# Patient Record
Sex: Female | Born: 1960 | Race: White | Hispanic: No | Marital: Married | Smoking: Current every day smoker
Health system: Southern US, Community
[De-identification: ages and names within clinical notes are randomized; demographics above are authoritative.]

## PROBLEM LIST (undated history)

## (undated) HISTORY — PX: TUBAL LIGATION: SHX77

---

## 2015-05-01 ENCOUNTER — Other Ambulatory Visit: Payer: Self-pay | Admitting: Internal Medicine

## 2015-05-14 ENCOUNTER — Other Ambulatory Visit: Payer: Self-pay

## 2015-05-14 ENCOUNTER — Encounter: Payer: Self-pay | Admitting: Internal Medicine

## 2015-05-14 DIAGNOSIS — I10 Essential (primary) hypertension: Secondary | ICD-10-CM | POA: Insufficient documentation

## 2015-05-14 DIAGNOSIS — E782 Mixed hyperlipidemia: Secondary | ICD-10-CM | POA: Insufficient documentation

## 2015-05-14 DIAGNOSIS — G43009 Migraine without aura, not intractable, without status migrainosus: Secondary | ICD-10-CM | POA: Insufficient documentation

## 2015-05-14 DIAGNOSIS — Z8742 Personal history of other diseases of the female genital tract: Secondary | ICD-10-CM | POA: Insufficient documentation

## 2015-05-14 DIAGNOSIS — F17200 Nicotine dependence, unspecified, uncomplicated: Secondary | ICD-10-CM | POA: Insufficient documentation

## 2015-05-14 DIAGNOSIS — F17210 Nicotine dependence, cigarettes, uncomplicated: Secondary | ICD-10-CM | POA: Insufficient documentation

## 2015-05-14 DIAGNOSIS — E785 Hyperlipidemia, unspecified: Secondary | ICD-10-CM | POA: Insufficient documentation

## 2015-05-14 MED ORDER — BENAZEPRIL-HYDROCHLOROTHIAZIDE 20-25 MG PO TABS: 1.0000 | ORAL_TABLET | Freq: Every day | ORAL | Status: DC

## 2015-08-28 ENCOUNTER — Encounter: Payer: Self-pay | Admitting: Internal Medicine

## 2015-08-28 ENCOUNTER — Ambulatory Visit (INDEPENDENT_AMBULATORY_CARE_PROVIDER_SITE_OTHER): Payer: BLUE CROSS/BLUE SHIELD | Admitting: Internal Medicine

## 2015-08-28 VITALS — BP 138/80 | HR 72 | Resp 16 | Ht 65.5 in | Wt 179.0 lb

## 2015-08-28 DIAGNOSIS — Z0000 Encounter for general adult medical examination without abnormal findings: Secondary | ICD-10-CM | POA: Diagnosis not present

## 2015-08-28 DIAGNOSIS — Z1211 Encounter for screening for malignant neoplasm of colon: Secondary | ICD-10-CM

## 2015-08-28 DIAGNOSIS — Z8742 Personal history of other diseases of the female genital tract: Secondary | ICD-10-CM | POA: Diagnosis not present

## 2015-08-28 DIAGNOSIS — Z1159 Encounter for screening for other viral diseases: Secondary | ICD-10-CM | POA: Diagnosis not present

## 2015-08-28 DIAGNOSIS — Z1239 Encounter for other screening for malignant neoplasm of breast: Secondary | ICD-10-CM | POA: Diagnosis not present

## 2015-08-28 DIAGNOSIS — E785 Hyperlipidemia, unspecified: Secondary | ICD-10-CM

## 2015-08-28 DIAGNOSIS — I10 Essential (primary) hypertension: Secondary | ICD-10-CM | POA: Diagnosis not present

## 2015-08-28 DIAGNOSIS — R319 Hematuria, unspecified: Secondary | ICD-10-CM | POA: Diagnosis not present

## 2015-08-28 DIAGNOSIS — Z114 Encounter for screening for human immunodeficiency virus [HIV]: Secondary | ICD-10-CM

## 2015-08-28 LAB — POC URINALYSIS WITH MICROSCOPIC (NON AUTO)MANUAL RESULT
Bacteria, UA: 0
Bilirubin, UA: NEGATIVE
Crystals: 0
EPITHELIAL CELLS, URINE PER MICROSCOPY: 0
Glucose, UA: NEGATIVE
KETONES UA: NEGATIVE
Leukocytes, UA: NEGATIVE
MUCUS UA: 0
NITRITE UA: NEGATIVE
PH UA: 6
RBC: 2 M/uL — AB (ref 4.04–5.48)
Spec Grav, UA: 1.015
Urobilinogen, UA: 0.2
WBC CASTS UA: 0

## 2015-08-28 MED ORDER — BENAZEPRIL-HYDROCHLOROTHIAZIDE 20-25 MG PO TABS: 1.0000 | ORAL_TABLET | Freq: Every day | ORAL | Status: DC

## 2015-08-28 NOTE — Patient Instructions (Addendum)
DASH Eating Plan DASH stands for "Dietary Approaches to Stop Hypertension." The DASH eating plan is a healthy eating plan that has been shown to reduce high blood pressure (hypertension). Additional health benefits may include reducing the risk of type 2 diabetes mellitus, heart disease, and stroke. The DASH eating plan may also help with weight loss. WHAT DO I NEED TO KNOW ABOUT THE DASH EATING PLAN? For the DASH eating plan, you will follow these general guidelines:  Choose foods with a percent daily value for sodium of less than 5% (as listed on the food label).  Use salt-free seasonings or herbs instead of table salt or sea salt.  Check with your health care provider or pharmacist before using salt substitutes.  Eat lower-sodium products, often labeled as "lower sodium" or "no salt added."  Eat fresh foods.  Eat more vegetables, fruits, and low-fat dairy products.  Choose whole grains. Look for the word "whole" as the first word in the ingredient list.  Choose fish and skinless chicken or turkey more often than red meat. Limit fish, poultry, and meat to 6 oz (170 g) each day.  Limit sweets, desserts, sugars, and sugary drinks.  Choose heart-healthy fats.  Limit cheese to 1 oz (28 g) per day.  Eat more home-cooked food and less restaurant, buffet, and fast food.  Limit fried foods.  Cook foods using methods other than frying.  Limit canned vegetables. If you do use them, rinse them well to decrease the sodium.  When eating at a restaurant, ask that your food be prepared with less salt, or no salt if possible. WHAT FOODS CAN I EAT? Seek help from a dietitian for individual calorie needs. Grains Whole grain or whole wheat bread. Brown rice. Whole grain or whole wheat pasta. Quinoa, bulgur, and whole grain cereals. Low-sodium cereals. Corn or whole wheat flour tortillas. Whole grain cornbread. Whole grain crackers. Low-sodium crackers. Vegetables Fresh or frozen vegetables  (raw, steamed, roasted, or grilled). Low-sodium or reduced-sodium tomato and vegetable juices. Low-sodium or reduced-sodium tomato sauce and paste. Low-sodium or reduced-sodium canned vegetables.  Fruits All fresh, canned (in natural juice), or frozen fruits. Meat and Other Protein Products Ground beef (85% or leaner), grass-fed beef, or beef trimmed of fat. Skinless chicken or turkey. Ground chicken or turkey. Pork trimmed of fat. All fish and seafood. Eggs. Dried beans, peas, or lentils. Unsalted nuts and seeds. Unsalted canned beans. Dairy Low-fat dairy products, such as skim or 1% milk, 2% or reduced-fat cheeses, low-fat ricotta or cottage cheese, or plain low-fat yogurt. Low-sodium or reduced-sodium cheeses. Fats and Oils Tub margarines without trans fats. Light or reduced-fat mayonnaise and salad dressings (reduced sodium). Avocado. Safflower, olive, or canola oils. Natural peanut or almond butter. Other Unsalted popcorn and pretzels. The items listed above may not be a complete list of recommended foods or beverages. Contact your dietitian for more options. WHAT FOODS ARE NOT RECOMMENDED? Grains White bread. White pasta. White rice. Refined cornbread. Bagels and croissants. Crackers that contain trans fat. Vegetables Creamed or fried vegetables. Vegetables in a cheese sauce. Regular canned vegetables. Regular canned tomato sauce and paste. Regular tomato and vegetable juices. Fruits Dried fruits. Canned fruit in light or heavy syrup. Fruit juice. Meat and Other Protein Products Fatty cuts of meat. Ribs, chicken wings, bacon, sausage, bologna, salami, chitterlings, fatback, hot dogs, bratwurst, and packaged luncheon meats. Salted nuts and seeds. Canned beans with salt. Dairy Whole or 2% milk, cream, half-and-half, and cream cheese. Whole-fat or sweetened yogurt. Full-fat   cheeses or blue cheese. Nondairy creamers and whipped toppings. Processed cheese, cheese spreads, or cheese  curds. Condiments Onion and garlic salt, seasoned salt, table salt, and sea salt. Canned and packaged gravies. Worcestershire sauce. Tartar sauce. Barbecue sauce. Teriyaki sauce. Soy sauce, including reduced sodium. Steak sauce. Fish sauce. Oyster sauce. Cocktail sauce. Horseradish. Ketchup and mustard. Meat flavorings and tenderizers. Bouillon cubes. Hot sauce. Tabasco sauce. Marinades. Taco seasonings. Relishes. Fats and Oils Butter, stick margarine, lard, shortening, ghee, and bacon fat. Coconut, palm kernel, or palm oils. Regular salad dressings. Other Pickles and olives. Salted popcorn and pretzels. The items listed above may not be a complete list of foods and beverages to avoid. Contact your dietitian for more information. WHERE CAN I FIND MORE INFORMATION? National Heart, Lung, and [employer] Institute: www.nhlbi.nih.gov/health/health-topics/topics/dash/   This information is not intended to replace advice given to you by your health care provider. Make sure you discuss any questions you have with your health care provider.   Document Released: 07/14/2011 Document Revised: 08/15/2014 Document Reviewed: 05/29/2013 Elsevier Interactive Patient Education 2016 Elsevier Inc[employer]   - Smoking Cessation, Tips for Success If you are ready to quit smoking, congratulations! You have chosen to help yourself be healthier. Cigarettes bring nicotine, tar, carbon monoxide, and other irritants into your body. Your lungs, heart, and blood vessels will be able to work better without these poisons. There are many different ways to quit smoking. Nicotine gum, nicotine patches, a nicotine inhaler, or nicotine nasal spray can help with physical craving. Hypnosis, support groups, and medicines help break the habit of smoking. WHAT THINGS CAN I DO TO MAKE QUITTING EASIER?  Here are some tips to help you quit for good:  Pick a date when you will quit smoking completely. Tell all of your friends and family about your  plan to quit on that date.  Do not try to slowly cut down on the number of cigarettes you are smoking. Pick a quit date and quit smoking completely starting on that day.  Throw away all cigarettes.   Clean and remove all ashtrays from your home, work, and car.  On a card, write down your reasons for quitting. Carry the card with you and read it when you get the urge to smoke.  Cleanse your body of nicotine. Drink enough water and fluids to keep your urine clear or pale yellow. Do this after quitting to flush the nicotine from your body.  Learn to predict your moods. Do not let a bad situation be your excuse to have a cigarette. Some situations in your life might tempt you into wanting a cigarette.  Never have "just one" cigarette. It leads to wanting another and another. Remind yourself of your decision to quit.  Change habits associated with smoking. If you smoked while driving or when feeling stressed, try other activities to replace smoking. Stand up when drinking your coffee. Brush your teeth after eating. Sit in a different chair when you read the paper. Avoid alcohol while trying to quit, and try to drink fewer caffeinated beverages. Alcohol and caffeine may urge you to smoke.  Avoid foods and drinks that can trigger a desire to smoke, such as sugary or spicy foods and alcohol.  Ask people who smoke not to smoke around you.  Have something planned to do right after eating or having a cup of coffee. For example, plan to take a walk or exercise.  Try a relaxation exercise to calm you down and decrease your stress. Remember,   you may be tense and nervous for the first 2 weeks after you quit, but this will pass.  Find new activities to keep your hands busy. Play with a pen, coin, or rubber band. Doodle or draw things on paper.  Brush your teeth right after eating. This will help cut down on the craving for the taste of tobacco after meals. You can also try mouthwash.   Use oral  substitutes in place of cigarettes. Try using lemon drops, carrots, cinnamon sticks, or chewing gum. Keep them handy so they are available when you have the urge to smoke.  When you have the urge to smoke, try deep breathing.  Designate your home as a nonsmoking area.  If you are a heavy smoker, ask your health care provider about a prescription for nicotine chewing gum. It can ease your withdrawal from nicotine.  Reward yourself. Set aside the cigarette money you save and buy yourself something nice.  Look for support from others. Join a support group or smoking cessation program. Ask someone at home or at work to help you with your plan to quit smoking.  Always ask yourself, "Do I need this cigarette or is this just a reflex?" Tell yourself, "Today, I choose not to smoke," or "I do not want to smoke." You are reminding yourself of your decision to quit.  Do not replace cigarette smoking with electronic cigarettes (commonly called e-cigarettes). The safety of e-cigarettes is unknown, and some may contain harmful chemicals.  If you relapse, do not give up! Plan ahead and think about what you will do the next time you get the urge to smoke. HOW WILL I FEEL WHEN I QUIT SMOKING? You may have symptoms of withdrawal because your body is used to nicotine (the addictive substance in cigarettes). You may crave cigarettes, be irritable, feel very hungry, cough often, get headaches, or have difficulty concentrating. The withdrawal symptoms are only temporary. They are strongest when you first quit but will go away within 10-14 days. When withdrawal symptoms occur, stay in control. Think about your reasons for quitting. Remind yourself that these are signs that your body is healing and getting used to being without cigarettes. Remember that withdrawal symptoms are easier to treat than the major diseases that smoking can cause.  Even after the withdrawal is over, expect periodic urges to smoke. However, these  cravings are generally short lived and will go away whether you smoke or not. Do not smoke! WHAT RESOURCES ARE AVAILABLE TO HELP ME QUIT SMOKING? Your health care provider can direct you to community resources or hospitals for support, which may include:  Group support.  Education.  Hypnosis.  Therapy.   This information is not intended to replace advice given to you by your health care provider. Make sure you discuss any questions you have with your health care provider.   Document Released: 04/22/2004 Document Revised: 08/15/2014 Document Reviewed: 01/10/2013 Elsevier Interactive Patient Education 2016 Elsevier Inc.   -  

## 2015-08-28 NOTE — Progress Notes (Signed)
**Note Renee-Identified via Obfuscation** Date:  08/28/2015   Name:  Renee Graham   DOB:  December 04, 1960   MRN:  [id number]   Chief Complaint: Annual Exam and Hypertension DELORICE Graham is a 55 y.o. female who presents today for her Complete Annual Exam. She feels well. She reports exercising intermittently. She reports she is sleeping well. She is hesitant to have a colonoscopy but agrees to a referral later this year.  Hypertension This is a chronic problem. The current episode started more than 1 year ago. The problem is unchanged. The problem is controlled. Associated symptoms include headaches. Pertinent negatives include no chest pain, palpitations or shortness of breath. Past treatments include ACE inhibitors and diuretics. The current treatment provides significant improvement. There are no compliance problems.      Review of Systems  Constitutional: Negative for chills, fatigue and unexpected weight change.  HENT: Negative for hearing loss.   Eyes: Negative for visual disturbance.  Respiratory: Negative for cough, chest tightness and shortness of breath.   Cardiovascular: Negative for chest pain, palpitations and leg swelling.  Gastrointestinal: Negative for abdominal pain, diarrhea and constipation.  Endocrine: Negative for polydipsia and polyuria.  Genitourinary: Negative for dysuria, urgency, hematuria, vaginal bleeding and vaginal discharge.  Musculoskeletal: Negative for arthralgias.  Neurological: Positive for headaches.    Patient Active Problem List   Diagnosis Date Noted  . Benign essential HTN 05/14/2015  . Cigarette smoker 05/14/2015  . H/O abnormal cervical Papanicolaou smear 05/14/2015  . Migraine without aura and responsive to treatment 05/14/2015  . Hyperlipidemia 05/14/2015    Prior to Admission medications   Medication Sig Start Date End Date Taking? Authorizing Provider  benazepril-hydrochlorthiazide (LOTENSIN HCT) 20-25 MG tablet Take 1 tablet by mouth daily. 05/14/15  Yes Reubin Milan, MD      No Known Allergies  Past Surgical History  Procedure Laterality Date  . Tubal ligation      Social History  Substance Use Topics  . Smoking status: Current Every Day Smoker  . Smokeless tobacco: None  . Alcohol Use: None    Medication list has been reviewed and updated.   Physical Exam  Constitutional: She is oriented to person, place, and time. She appears well-developed and well-nourished. No distress.  HENT:  Head: Normocephalic and atraumatic.  Right Ear: Tympanic membrane and ear canal normal.  Left Ear: Tympanic membrane and ear canal normal.  Nose: Right sinus exhibits no maxillary sinus tenderness. Left sinus exhibits no maxillary sinus tenderness.  Mouth/Throat: Uvula is midline and oropharynx is clear and moist.  Eyes: Conjunctivae and EOM are normal. Right eye exhibits no discharge. Left eye exhibits no discharge. No scleral icterus.  Neck: Normal range of motion. Carotid bruit is not present. No erythema present. No thyromegaly present.  Cardiovascular: Normal rate, regular rhythm, normal heart sounds and normal pulses.   Pulmonary/Chest: Effort normal. No respiratory distress. She has no wheezes. Right breast exhibits no mass, no nipple discharge, no skin change and no tenderness. Left breast exhibits no mass, no nipple discharge, no skin change and no tenderness.  Abdominal: Soft. Bowel sounds are normal. There is no hepatosplenomegaly. There is no tenderness. There is no CVA tenderness.  Genitourinary: Uterus normal. There is no rash or tenderness on the right labia. There is no rash or tenderness on the left labia. Cervix exhibits no motion tenderness, no discharge and no friability. Right adnexum displays no mass, no tenderness and no fullness. Left adnexum displays no mass, no tenderness and no fullness.  No erythema, tenderness or bleeding in the vagina. No vaginal discharge found.  Moderate anterior vaginal prolapse  Musculoskeletal: Normal range of motion.   Lymphadenopathy:    She has no cervical adenopathy.    She has no axillary adenopathy.  Neurological: She is alert and oriented to person, place, and time. She has normal reflexes. No cranial nerve deficit or sensory deficit.  Skin: Skin is warm, dry and intact. No rash noted.  Psychiatric: She has a normal mood and affect. Her speech is normal and behavior is normal. Thought content normal.  Nursing note and vitals reviewed.   BP 138/80 mmHg  Pulse 72  Resp 16  Ht 5' 5.5" (1.664 m)  Wt 179 lb (81.194 kg)  BMI 29.32 kg/m2  LMP 02/06/2015 (Approximate)  Assessment and Plan: 1. Annual physical exam encouraged to reduce cigarette use with a goal to quit completely - CBC with Differential/Platelet - POC urinalysis w microscopic (non auto)  2. Need for hepatitis C screening test - Hepatitis C antibody  3. Colon cancer screening Will defer for now due to Urology referral  4. Benign essential HTN controlled - benazepril-hydrochlorthiazide (LOTENSIN HCT) 20-25 MG tablet; Take 1 tablet by mouth daily.  Dispense: 90 tablet; Refill: 3  5. H/O abnormal cervical Papanicolaou smear Repeated today Pelvic exam normal - Pap IG and HPV (high risk) DNA detection  6. Hyperlipidemia Will advise if medication is needed - Comprehensive metabolic panel - Lipid panel - TSH  7. Breast cancer screening Pt will schedule Mammogram in [place]  8. Hematuria Persistent in a smoker - Ambulatory referral to Urology  10. Encounter for screening for HIV - HIV antibody   Bari Edward, MD [facility]  08/28/2015

## 2015-08-29 LAB — TSH: TSH: 1.47 u[IU]/mL (ref 0.450–4.500)

## 2015-08-29 LAB — LIPID PANEL
CHOL/HDL RATIO: 5 ratio — AB (ref 0.0–4.4)
Cholesterol, Total: 269 mg/dL — ABNORMAL HIGH (ref 100–199)
HDL: 54 mg/dL (ref >39)
LDL CALC: 173 mg/dL — AB (ref 0–99)
TRIGLYCERIDES: 210 mg/dL — AB (ref 0–149)
VLDL Cholesterol Cal: 42 mg/dL — ABNORMAL HIGH (ref 5–40)

## 2015-08-29 LAB — COMPREHENSIVE METABOLIC PANEL
A/G RATIO: 1.9 (ref 1.1–2.5)
ALBUMIN: 4.7 g/dL (ref 3.5–5.5)
ALK PHOS: 73 IU/L (ref 39–117)
ALT: 24 IU/L (ref 0–32)
AST: 37 IU/L (ref 0–40)
BUN / CREAT RATIO: 11 (ref 9–23)
BUN: 10 mg/dL (ref 6–24)
Bilirubin Total: 0.6 mg/dL (ref 0.0–1.2)
CALCIUM: 10.6 mg/dL — AB (ref 8.7–10.2)
CO2: 26 mmol/L (ref 18–29)
CREATININE: 0.93 mg/dL (ref 0.57–1.00)
Chloride: 99 mmol/L (ref 96–106)
GFR calc Af Amer: 80 mL/min/{1.73_m2} (ref >59)
GFR, EST NON AFRICAN AMERICAN: 69 mL/min/{1.73_m2} (ref >59)
GLOBULIN, TOTAL: 2.5 g/dL (ref 1.5–4.5)
Glucose: 89 mg/dL (ref 65–99)
POTASSIUM: 4.1 mmol/L (ref 3.5–5.2)
SODIUM: 141 mmol/L (ref 134–144)
Total Protein: 7.2 g/dL (ref 6.0–8.5)

## 2015-08-29 LAB — CBC WITH DIFFERENTIAL/PLATELET
BASOS: 1 %
Basophils Absolute: 0 10*3/uL (ref 0.0–0.2)
EOS (ABSOLUTE): 0.2 10*3/uL (ref 0.0–0.4)
EOS: 2 %
HEMATOCRIT: 46.7 % — AB (ref 34.0–46.6)
HEMOGLOBIN: 15.7 g/dL (ref 11.1–15.9)
Immature Grans (Abs): 0 10*3/uL (ref 0.0–0.1)
Immature Granulocytes: 0 %
LYMPHS ABS: 2.6 10*3/uL (ref 0.7–3.1)
Lymphs: 37 %
MCH: 32.6 pg (ref 26.6–33.0)
MCHC: 33.6 g/dL (ref 31.5–35.7)
MCV: 97 fL (ref 79–97)
MONOCYTES: 7 %
MONOS ABS: 0.5 10*3/uL (ref 0.1–0.9)
Neutrophils Absolute: 3.8 10*3/uL (ref 1.4–7.0)
Neutrophils: 53 %
Platelets: 271 10*3/uL (ref 150–379)
RBC: 4.81 x10E6/uL (ref 3.77–5.28)
RDW: 13 % (ref 12.3–15.4)
WBC: 7.1 10*3/uL (ref 3.4–10.8)

## 2015-08-29 LAB — HIV ANTIBODY (ROUTINE TESTING W REFLEX): HIV Screen 4th Generation wRfx: NONREACTIVE

## 2015-08-29 LAB — HEPATITIS C ANTIBODY: Hep C Virus Ab: <0.1 s/co ratio (ref 0.0–0.9)

## 2015-08-31 ENCOUNTER — Telehealth: Payer: Self-pay

## 2015-08-31 NOTE — Telephone Encounter (Signed)
Spoke with patient. Patient advised of all results and verbalized understanding. Will call back with any future questions or concerns. MAH  

## 2015-08-31 NOTE — Telephone Encounter (Signed)
-----   Message from Reubin Milan, MD sent at 08/29/2015  5:10 PM EST ----- Cholesterol is very high - 10 yr risk of ASCVD is 10% so should be taking a cholesterol medication.  If agreeable, I will send to pharmacy and request a follow up OV and labs in 6 months.  Calcium is slightly high - stop any calcium supplements but add vitamin D.  Other labs are normal.  Hep C and HIV are negative.

## 2015-08-31 NOTE — Telephone Encounter (Signed)
Left message for patient to call back  

## 2015-09-09 ENCOUNTER — Telehealth: Payer: Self-pay

## 2015-09-09 LAB — PAP IG AND HPV HIGH-RISK
HPV, high-risk: NEGATIVE
PAP Smear Comment: 0

## 2015-09-09 NOTE — Telephone Encounter (Signed)
-----   Message from Reubin Milan, MD sent at 09/09/2015  9:46 AM EST ----- Pap is normal.

## 2015-09-09 NOTE — Telephone Encounter (Signed)
Spoke with patient. Patient advised of all results and verbalized understanding. Will call back with any future questions or concerns. MAH  

## 2016-02-29 ENCOUNTER — Ambulatory Visit (INDEPENDENT_AMBULATORY_CARE_PROVIDER_SITE_OTHER): Payer: BLUE CROSS/BLUE SHIELD | Admitting: Internal Medicine

## 2016-02-29 ENCOUNTER — Encounter: Payer: Self-pay | Admitting: Internal Medicine

## 2016-02-29 VITALS — BP 134/84 | HR 68 | Ht 65.5 in | Wt 169.4 lb

## 2016-02-29 DIAGNOSIS — I10 Essential (primary) hypertension: Secondary | ICD-10-CM

## 2016-02-29 DIAGNOSIS — E785 Hyperlipidemia, unspecified: Secondary | ICD-10-CM | POA: Diagnosis not present

## 2016-02-29 DIAGNOSIS — R3129 Other microscopic hematuria: Secondary | ICD-10-CM | POA: Insufficient documentation

## 2016-02-29 NOTE — Progress Notes (Signed)
    Date:  02/29/2016   Name:  Renee Graham   DOB:  August 22, 1960   MRN:  [id number]   Chief Complaint: Hyperlipidemia Patient had lipids done in January.  10 yr risk was elevated at 10%.  She did not want to start medication at that time.  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  She has lost 10 lbs.  She cut back on her alcohol intake.  She does continue to smoke.   Hypertension  This is a chronic problem. The current episode started more than 1 year ago. The problem is unchanged. The problem is controlled. Pertinent negatives include no chest pain, palpitations or shortness of breath.     Review of Systems  Constitutional: Negative for chills, fatigue and fever.  Eyes: Negative for visual disturbance.  Respiratory: Negative for chest tightness, shortness of breath and wheezing.   Cardiovascular: Negative for chest pain and palpitations.  Gastrointestinal: Negative for abdominal pain and constipation.  Genitourinary: Negative for difficulty urinating and menstrual problem (but had a period in May after 10 months without one.).  Musculoskeletal: Negative for arthralgias.  Psychiatric/Behavioral: Negative for dysphoric mood. The patient is not nervous/anxious.     Patient Active Problem List   Diagnosis Date Noted  . Benign essential HTN 05/14/2015  . Cigarette smoker 05/14/2015  . H/O abnormal cervical Papanicolaou smear 05/14/2015  . Migraine without aura and responsive to treatment 05/14/2015  . Hyperlipidemia 05/14/2015    Prior to Admission medications   Medication Sig Start Date End Date Taking? Authorizing Provider  benazepril-hydrochlorthiazide (LOTENSIN HCT) 20-25 MG tablet Take 1 tablet by mouth daily. 08/28/15  Yes Reubin Milan, MD    No Known Allergies  Past Surgical History:  Procedure Laterality Date  . TUBAL LIGATION      Social History  Substance Use Topics  . Smoking status: Current Every Day Smoker    Types:  Cigarettes  . Smokeless tobacco: Not on file  . Alcohol use 12.6 oz/week    21 Glasses of wine per week     Medication list has been reviewed and updated.   Physical Exam  Constitutional: She is oriented to person, place, and time. She appears well-developed. No distress.  HENT:  Head: Normocephalic and atraumatic.  Neck: Normal range of motion. Neck supple. No thyromegaly present.  Cardiovascular: Normal rate, regular rhythm and normal heart sounds.   Pulmonary/Chest: Effort normal and breath sounds normal. No respiratory distress. She has no wheezes. She has no rales.  Musculoskeletal: Normal range of motion. She exhibits no edema.  Neurological: She is alert and oriented to person, place, and time.  Skin: Skin is warm and dry. No rash noted.  Psychiatric: She has a normal mood and affect. Her behavior is normal. Thought content normal.  Nursing note and vitals reviewed.   BP 134/84 (BP Location: Right Arm, Patient Position: Sitting, Cuff Size: Normal)   Pulse 68   Ht 5' 5.5" (1.664 m)   Wt 169 lb 6.4 oz (76.8 kg)   LMP 12/07/2015   BMI 27.76 kg/m   Assessment and Plan: 1. Hyperlipidemia Will check for improvement otherwise need to start medication - Lipid panel  2. Benign essential HTN controlled   Bari Edward, MD [facility]  02/29/2016

## 2016-03-01 LAB — LIPID PANEL
CHOL/HDL RATIO: 5 ratio — AB (ref 0.0–4.4)
Cholesterol, Total: 234 mg/dL — ABNORMAL HIGH (ref 100–199)
HDL: 47 mg/dL (ref >39)
LDL Calculated: 149 mg/dL — ABNORMAL HIGH (ref 0–99)
Triglycerides: 188 mg/dL — ABNORMAL HIGH (ref 0–149)
VLDL Cholesterol Cal: 38 mg/dL (ref 5–40)

## 2016-08-29 ENCOUNTER — Encounter: Payer: Self-pay | Admitting: Internal Medicine

## 2016-08-29 ENCOUNTER — Ambulatory Visit (INDEPENDENT_AMBULATORY_CARE_PROVIDER_SITE_OTHER): Payer: BLUE CROSS/BLUE SHIELD | Admitting: Internal Medicine

## 2016-08-29 VITALS — BP 136/84 | HR 86 | Temp 97.6°F | Ht 65.5 in | Wt 166.0 lb

## 2016-08-29 DIAGNOSIS — R3129 Other microscopic hematuria: Secondary | ICD-10-CM

## 2016-08-29 DIAGNOSIS — I10 Essential (primary) hypertension: Secondary | ICD-10-CM | POA: Diagnosis not present

## 2016-08-29 DIAGNOSIS — Z0000 Encounter for general adult medical examination without abnormal findings: Secondary | ICD-10-CM

## 2016-08-29 DIAGNOSIS — E782 Mixed hyperlipidemia: Secondary | ICD-10-CM | POA: Diagnosis not present

## 2016-08-29 DIAGNOSIS — R928 Other abnormal and inconclusive findings on diagnostic imaging of breast: Secondary | ICD-10-CM | POA: Diagnosis not present

## 2016-08-29 DIAGNOSIS — F17210 Nicotine dependence, cigarettes, uncomplicated: Secondary | ICD-10-CM | POA: Diagnosis not present

## 2016-08-29 LAB — POCT URINALYSIS DIPSTICK
BILIRUBIN UA: NEGATIVE
Glucose, UA: NEGATIVE
Ketones, UA: NEGATIVE
LEUKOCYTES UA: NEGATIVE
NITRITE UA: NEGATIVE
PH UA: 5
PROTEIN UA: NEGATIVE
Spec Grav, UA: <=1.005
Urobilinogen, UA: 0.2

## 2016-08-29 NOTE — Progress Notes (Signed)
Date:  08/29/2016   Name:  Renee CampbellLisa G Graham   DOB:  01-03-1961   MRN:  [id number]   Chief Complaint: Annual Exam Renee Graham is a 56 y.o. female who presents today for her Complete Annual Exam. She feels fairly well. She reports exercising none but has physically demanding job. She reports she is sleeping poorly due to hot flashes. She would like to try Black cohosh.  She also had a mammogram in March showing left density/ductal abnormality and 6 mo repeat recommended.  She did not make that appointment.  Hypertension  This is a chronic problem. The problem is controlled. Pertinent negatives include no chest pain, headaches, palpitations or shortness of breath. Past treatments include ACE inhibitors and diuretics.    Review of Systems  Constitutional: Negative for chills, fatigue and fever.  HENT: Negative for congestion, hearing loss, tinnitus, trouble swallowing and voice change.   Eyes: Negative for visual disturbance.  Respiratory: Positive for cough. Negative for chest tightness, shortness of breath and wheezing.   Cardiovascular: Negative for chest pain, palpitations and leg swelling.  Gastrointestinal: Negative for abdominal pain, constipation, diarrhea and vomiting.  Endocrine: Negative for polydipsia and polyuria.  Genitourinary: Negative for dysuria, frequency, genital sores, vaginal bleeding and vaginal discharge.  Musculoskeletal: Negative for arthralgias, gait problem and joint swelling.  Skin: Negative for color change and rash.  Neurological: Negative for dizziness, tremors, light-headedness and headaches.  Hematological: Negative for adenopathy. Does not bruise/bleed easily.  Psychiatric/Behavioral: Negative for dysphoric mood and sleep disturbance. The patient is not nervous/anxious.     Patient Active Problem List   Diagnosis Date Noted  . Hematuria, microscopic 02/29/2016  . Benign essential HTN 05/14/2015  . Cigarette smoker 05/14/2015  . H/O abnormal cervical  Papanicolaou smear 05/14/2015  . Migraine without aura and responsive to treatment 05/14/2015  . Hyperlipidemia 05/14/2015    Prior to Admission medications   Medication Sig Start Date End Date Taking? Authorizing Provider  benazepril-hydrochlorthiazide (LOTENSIN HCT) 20-25 MG tablet Take 1 tablet by mouth daily. 08/28/15   Reubin MilanLaura H Evertte Sones, MD    No Known Allergies  Past Surgical History:  Procedure Laterality Date  . TUBAL LIGATION      Social History  Substance Use Topics  . Smoking status: Current Every Day Smoker    Types: Cigarettes  . Smokeless tobacco: Current User  . Alcohol use 12.6 oz/week    21 Glasses of wine per week     Medication list has been reviewed and updated.   Physical Exam  Constitutional: She is oriented to person, place, and time. She appears well-developed and well-nourished. No distress.  HENT:  Head: Normocephalic and atraumatic.  Right Ear: Tympanic membrane and ear canal normal.  Left Ear: Tympanic membrane and ear canal normal.  Nose: Right sinus exhibits no maxillary sinus tenderness. Left sinus exhibits no maxillary sinus tenderness.  Mouth/Throat: Uvula is midline and oropharynx is clear and moist.  Eyes: Conjunctivae and EOM are normal. Right eye exhibits no discharge. Left eye exhibits no discharge. No scleral icterus.  Neck: Normal range of motion. Carotid bruit is not present. No erythema present. No thyromegaly present.  Cardiovascular: Normal rate, regular rhythm, normal heart sounds and normal pulses.   Pulmonary/Chest: Effort normal. No respiratory distress. She has no wheezes. Right breast exhibits no mass, no nipple discharge, no skin change and no tenderness. Left breast exhibits no mass, no nipple discharge, no skin change and no tenderness.  Abdominal: Soft. Bowel sounds are  normal. There is no hepatosplenomegaly. There is no tenderness. There is no CVA tenderness.  Musculoskeletal: Normal range of motion.  Lymphadenopathy:     She has no cervical adenopathy.    She has no axillary adenopathy.  Neurological: She is alert and oriented to person, place, and time. She has normal reflexes. No cranial nerve deficit or sensory deficit.  Skin: Skin is warm, dry and intact. No rash noted.  Psychiatric: She has a normal mood and affect. Her speech is normal and behavior is normal. Thought content normal.  Nursing note and vitals reviewed.   Pulse 86   Temp 97.6 F (36.4 C)   Ht 5' 5.5" (1.664 m)   Wt 166 lb (75.3 kg)   SpO2 98%   BMI 27.20 kg/m   Assessment and Plan: 1. Annual physical exam - POCT urinalysis dipstick Try black cohosh for hot flashes  2. Benign essential HTN controlled - CBC with Differential/Platelet - Comprehensive metabolic panel - TSH  3. Mixed hyperlipidemia Continue low fat diet; will advise on medication - Lipid panel  4. Cigarette smoker Encourage quitting -  5. Hematuria, microscopic Work up negative Mild persistently positive urine noted  6. Abnormal mammogram of left breast Needs follow up - US BREAST LTD UNI LEFT INC AXILLA; Future - MM Digital Diagnostic Unilat L   Bari Edward, MD [facility]  08/29/2016

## 2016-08-29 NOTE — Patient Instructions (Signed)
Breast Self-Awareness Introduction Breast self-awareness means being familiar with how your breasts look and feel. It involves checking your breasts regularly and reporting any changes to your health care provider. Practicing breast self-awareness is important. A change in your breasts can be a sign of a serious medical problem. Being familiar with how your breasts look and feel allows you to find any problems early, when treatment is more likely to be successful. All women should practice breast self-awareness, including women who have had breast implants. How to do a breast self-exam One way to learn what is normal for your breasts and whether your breasts are changing is to do a breast self-exam. To do a breast self-exam: Look for Changes  1. Remove all the clothing above your waist. 2. Stand in front of a mirror in a room with good lighting. 3. Put your hands on your hips. 4. Push your hands firmly downward. 5. Compare your breasts in the mirror. Look for differences between them (asymmetry), such as:  Differences in shape.  Differences in size.  Puckers, dips, and bumps in one breast and not the other. 6. Look at each breast for changes in your skin, such as:  Redness.  Scaly areas. 7. Look for changes in your nipples, such as:  Discharge.  Bleeding.  Dimpling.  Redness.  A change in position. Feel for Changes  Carefully feel your breasts for lumps and changes. It is best to do this while lying on your back on the floor and again while sitting or standing in the shower or tub with soapy water on your skin. Feel each breast in the following way:  Place the arm on the side of the breast you are examining above your head.  Feel your breast with the other hand.  Start in the nipple area and make  inch (2 cm) overlapping circles to feel your breast. Use the pads of your three middle fingers to do this. Apply light pressure, then medium pressure, then firm pressure. The light  pressure will allow you to feel the tissue closest to the skin. The medium pressure will allow you to feel the tissue that is a little deeper. The firm pressure will allow you to feel the tissue close to the ribs.  Continue the overlapping circles, moving downward over the breast until you feel your ribs below your breast.  Move one finger-width toward the center of the body. Continue to use the  inch (2 cm) overlapping circles to feel your breast as you move slowly up toward your collarbone.  Continue the up and down exam using all three pressures until you reach your armpit. Write Down What You Find  Write down what is normal for each breast and any changes that you find. Keep a written record with breast changes or normal findings for each breast. By writing this information down, you do not need to depend only on memory for size, tenderness, or location. Write down where you are in your menstrual cycle, if you are still menstruating. If you are having trouble noticing differences in your breasts, do not get discouraged. With time you will become more familiar with the variations in your breasts and more comfortable with the exam. How often should I examine my breasts? Examine your breasts every month. If you are breastfeeding, the best time to examine your breasts is after a feeding or after using a breast pump. If you menstruate, the best time to examine your breasts is 5-7 days after your   period is over. During your period, your breasts are lumpier, and it may be more difficult to notice changes. When should I see my health care provider? See your health care provider if you notice:  A change in shape or size of your breasts or nipples.  A change in the skin of your breast or nipples, such as a reddened or scaly area.  Unusual discharge from your nipples.  A lump or thick area that was not there before.  Pain in your breasts.  Anything that concerns you. This information is not  intended to replace advice given to you by your health care provider. Make sure you discuss any questions you have with your health care provider. Document Released: 07/25/2005 Document Revised: 12/31/2015 Document Reviewed: 06/14/2015  2017 Elsevier  

## 2016-08-30 LAB — LIPID PANEL
CHOLESTEROL TOTAL: 223 mg/dL — AB (ref 100–199)
Chol/HDL Ratio: 4.6 ratio units — ABNORMAL HIGH (ref 0.0–4.4)
HDL: 48 mg/dL (ref >39)
LDL CALC: 129 mg/dL — AB (ref 0–99)
Triglycerides: 230 mg/dL — ABNORMAL HIGH (ref 0–149)
VLDL CHOLESTEROL CAL: 46 mg/dL — AB (ref 5–40)

## 2016-08-30 LAB — COMPREHENSIVE METABOLIC PANEL
ALBUMIN: 4.5 g/dL (ref 3.5–5.5)
ALT: 18 IU/L (ref 0–32)
AST: 33 IU/L (ref 0–40)
Albumin/Globulin Ratio: 1.8 (ref 1.2–2.2)
Alkaline Phosphatase: 84 IU/L (ref 39–117)
BUN / CREAT RATIO: 8 — AB (ref 9–23)
BUN: 7 mg/dL (ref 6–24)
Bilirubin Total: 0.4 mg/dL (ref 0.0–1.2)
CALCIUM: 9.9 mg/dL (ref 8.7–10.2)
CO2: 23 mmol/L (ref 18–29)
CREATININE: 0.87 mg/dL (ref 0.57–1.00)
Chloride: 98 mmol/L (ref 96–106)
GFR calc non Af Amer: 75 mL/min/{1.73_m2} (ref >59)
GFR, EST AFRICAN AMERICAN: 86 mL/min/{1.73_m2} (ref >59)
GLUCOSE: 76 mg/dL (ref 65–99)
Globulin, Total: 2.5 g/dL (ref 1.5–4.5)
Potassium: 3.8 mmol/L (ref 3.5–5.2)
Sodium: 141 mmol/L (ref 134–144)
TOTAL PROTEIN: 7 g/dL (ref 6.0–8.5)

## 2016-08-30 LAB — TSH: TSH: 1.72 u[IU]/mL (ref 0.450–4.500)

## 2016-08-30 LAB — CBC WITH DIFFERENTIAL/PLATELET
BASOS ABS: 0 10*3/uL (ref 0.0–0.2)
Basos: 1 %
EOS (ABSOLUTE): 0.2 10*3/uL (ref 0.0–0.4)
Eos: 3 %
HEMOGLOBIN: 15.3 g/dL (ref 11.1–15.9)
Hematocrit: 45 % (ref 34.0–46.6)
IMMATURE GRANULOCYTES: 0 %
Immature Grans (Abs): 0 10*3/uL (ref 0.0–0.1)
Lymphocytes Absolute: 1.5 10*3/uL (ref 0.7–3.1)
Lymphs: 18 %
MCH: 32.6 pg (ref 26.6–33.0)
MCHC: 34 g/dL (ref 31.5–35.7)
MCV: 96 fL (ref 79–97)
MONOCYTES: 8 %
Monocytes Absolute: 0.7 10*3/uL (ref 0.1–0.9)
NEUTROS ABS: 6 10*3/uL (ref 1.4–7.0)
Neutrophils: 70 %
Platelets: 259 10*3/uL (ref 150–379)
RBC: 4.69 x10E6/uL (ref 3.77–5.28)
RDW: 13.6 % (ref 12.3–15.4)
WBC: 8.4 10*3/uL (ref 3.4–10.8)

## 2016-10-04 DIAGNOSIS — N6489 Other specified disorders of breast: Secondary | ICD-10-CM | POA: Diagnosis not present

## 2016-10-25 ENCOUNTER — Other Ambulatory Visit: Payer: Self-pay | Admitting: Internal Medicine

## 2016-10-25 DIAGNOSIS — I10 Essential (primary) hypertension: Secondary | ICD-10-CM

## 2017-02-27 ENCOUNTER — Ambulatory Visit: Payer: BLUE CROSS/BLUE SHIELD | Admitting: Internal Medicine

## 2017-07-20 ENCOUNTER — Other Ambulatory Visit: Payer: Self-pay | Admitting: Internal Medicine

## 2017-07-20 DIAGNOSIS — I10 Essential (primary) hypertension: Secondary | ICD-10-CM

## 2017-09-04 ENCOUNTER — Ambulatory Visit (INDEPENDENT_AMBULATORY_CARE_PROVIDER_SITE_OTHER): Payer: BLUE CROSS/BLUE SHIELD | Admitting: Internal Medicine

## 2017-09-04 ENCOUNTER — Encounter: Payer: Self-pay | Admitting: Internal Medicine

## 2017-09-04 VITALS — BP 138/92 | HR 70 | Ht 65.5 in | Wt 167.0 lb

## 2017-09-04 DIAGNOSIS — Z1211 Encounter for screening for malignant neoplasm of colon: Secondary | ICD-10-CM

## 2017-09-04 DIAGNOSIS — Z0001 Encounter for general adult medical examination with abnormal findings: Secondary | ICD-10-CM | POA: Diagnosis not present

## 2017-09-04 DIAGNOSIS — E782 Mixed hyperlipidemia: Secondary | ICD-10-CM

## 2017-09-04 DIAGNOSIS — Z0000 Encounter for general adult medical examination without abnormal findings: Secondary | ICD-10-CM

## 2017-09-04 DIAGNOSIS — Z1239 Encounter for other screening for malignant neoplasm of breast: Secondary | ICD-10-CM

## 2017-09-04 DIAGNOSIS — I10 Essential (primary) hypertension: Secondary | ICD-10-CM | POA: Diagnosis not present

## 2017-09-04 DIAGNOSIS — Z8742 Personal history of other diseases of the female genital tract: Secondary | ICD-10-CM

## 2017-09-04 LAB — POCT URINALYSIS DIPSTICK
BILIRUBIN UA: NEGATIVE
GLUCOSE UA: NEGATIVE
Ketones, UA: NEGATIVE
Leukocytes, UA: NEGATIVE
Nitrite, UA: NEGATIVE
Protein, UA: NEGATIVE
SPEC GRAV UA: 1.01 (ref 1.010–1.025)
Urobilinogen, UA: 0.2 E.U./dL
pH, UA: 6.5 (ref 5.0–8.0)

## 2017-09-04 NOTE — Patient Instructions (Signed)
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
## 2017-09-05 LAB — COMPREHENSIVE METABOLIC PANEL
ALT: 19 IU/L (ref 0–32)
AST: 32 IU/L (ref 0–40)
Albumin/Globulin Ratio: 1.9 (ref 1.2–2.2)
Albumin: 4.4 g/dL (ref 3.5–5.5)
Alkaline Phosphatase: 72 IU/L (ref 39–117)
BUN/Creatinine Ratio: 12 (ref 9–23)
BUN: 10 mg/dL (ref 6–24)
Bilirubin Total: 0.5 mg/dL (ref 0.0–1.2)
CALCIUM: 10.4 mg/dL — AB (ref 8.7–10.2)
CO2: 25 mmol/L (ref 20–29)
CREATININE: 0.81 mg/dL (ref 0.57–1.00)
Chloride: 101 mmol/L (ref 96–106)
GFR calc Af Amer: 93 mL/min/{1.73_m2} (ref >59)
GFR, EST NON AFRICAN AMERICAN: 81 mL/min/{1.73_m2} (ref >59)
GLUCOSE: 78 mg/dL (ref 65–99)
Globulin, Total: 2.3 g/dL (ref 1.5–4.5)
Potassium: 4.4 mmol/L (ref 3.5–5.2)
Sodium: 141 mmol/L (ref 134–144)
Total Protein: 6.7 g/dL (ref 6.0–8.5)

## 2017-09-05 LAB — CBC WITH DIFFERENTIAL/PLATELET
Basophils Absolute: 0.1 10*3/uL (ref 0.0–0.2)
Basos: 1 %
EOS (ABSOLUTE): 0.1 10*3/uL (ref 0.0–0.4)
Eos: 2 %
Hematocrit: 45.6 % (ref 34.0–46.6)
Hemoglobin: 15.2 g/dL (ref 11.1–15.9)
IMMATURE GRANULOCYTES: 0 %
Immature Grans (Abs): 0 10*3/uL (ref 0.0–0.1)
Lymphocytes Absolute: 2.7 10*3/uL (ref 0.7–3.1)
Lymphs: 37 %
MCH: 32.5 pg (ref 26.6–33.0)
MCHC: 33.3 g/dL (ref 31.5–35.7)
MCV: 98 fL — ABNORMAL HIGH (ref 79–97)
MONOS ABS: 0.5 10*3/uL (ref 0.1–0.9)
Monocytes: 7 %
NEUTROS PCT: 53 %
Neutrophils Absolute: 3.9 10*3/uL (ref 1.4–7.0)
PLATELETS: 261 10*3/uL (ref 150–379)
RBC: 4.67 x10E6/uL (ref 3.77–5.28)
RDW: 13.2 % (ref 12.3–15.4)
WBC: 7.2 10*3/uL (ref 3.4–10.8)

## 2017-09-05 LAB — LIPID PANEL
CHOL/HDL RATIO: 4.5 ratio — AB (ref 0.0–4.4)
Cholesterol, Total: 224 mg/dL — ABNORMAL HIGH (ref 100–199)
HDL: 50 mg/dL (ref >39)
LDL CALC: 133 mg/dL — AB (ref 0–99)
Triglycerides: 204 mg/dL — ABNORMAL HIGH (ref 0–149)
VLDL Cholesterol Cal: 41 mg/dL — ABNORMAL HIGH (ref 5–40)

## 2017-09-05 LAB — TSH: TSH: 1.58 u[IU]/mL (ref 0.450–4.500)

## 2017-09-06 ENCOUNTER — Telehealth: Payer: Self-pay

## 2017-09-06 NOTE — Telephone Encounter (Signed)
ERROR

## 2017-09-07 ENCOUNTER — Encounter: Payer: Self-pay | Admitting: Internal Medicine

## 2017-09-07 DIAGNOSIS — F4323 Adjustment disorder with mixed anxiety and depressed mood: Secondary | ICD-10-CM | POA: Diagnosis not present

## 2017-09-12 ENCOUNTER — Other Ambulatory Visit: Payer: Self-pay | Admitting: Internal Medicine

## 2017-09-12 DIAGNOSIS — Z1211 Encounter for screening for malignant neoplasm of colon: Secondary | ICD-10-CM

## 2017-09-12 DIAGNOSIS — Z1212 Encounter for screening for malignant neoplasm of rectum: Principal | ICD-10-CM

## 2017-09-12 NOTE — Progress Notes (Unsigned)
Gi

## 2017-09-18 DIAGNOSIS — F4323 Adjustment disorder with mixed anxiety and depressed mood: Secondary | ICD-10-CM | POA: Diagnosis not present

## 2017-10-02 DIAGNOSIS — F4323 Adjustment disorder with mixed anxiety and depressed mood: Secondary | ICD-10-CM | POA: Diagnosis not present

## 2017-10-09 DIAGNOSIS — F4323 Adjustment disorder with mixed anxiety and depressed mood: Secondary | ICD-10-CM | POA: Diagnosis not present

## 2017-10-16 DIAGNOSIS — F4323 Adjustment disorder with mixed anxiety and depressed mood: Secondary | ICD-10-CM | POA: Diagnosis not present

## 2017-10-23 DIAGNOSIS — F4323 Adjustment disorder with mixed anxiety and depressed mood: Secondary | ICD-10-CM | POA: Diagnosis not present

## 2017-11-02 DIAGNOSIS — F4323 Adjustment disorder with mixed anxiety and depressed mood: Secondary | ICD-10-CM | POA: Diagnosis not present

## 2017-11-09 DIAGNOSIS — F4323 Adjustment disorder with mixed anxiety and depressed mood: Secondary | ICD-10-CM | POA: Diagnosis not present

## 2017-11-20 DIAGNOSIS — F4323 Adjustment disorder with mixed anxiety and depressed mood: Secondary | ICD-10-CM | POA: Diagnosis not present

## 2017-11-28 DIAGNOSIS — F4323 Adjustment disorder with mixed anxiety and depressed mood: Secondary | ICD-10-CM | POA: Diagnosis not present

## 2017-12-07 DIAGNOSIS — F4323 Adjustment disorder with mixed anxiety and depressed mood: Secondary | ICD-10-CM | POA: Diagnosis not present

## 2017-12-17 DIAGNOSIS — H60392 Other infective otitis externa, left ear: Secondary | ICD-10-CM | POA: Diagnosis not present

## 2017-12-17 DIAGNOSIS — H6122 Impacted cerumen, left ear: Secondary | ICD-10-CM | POA: Diagnosis not present

## 2017-12-17 DIAGNOSIS — H9201 Otalgia, right ear: Secondary | ICD-10-CM | POA: Diagnosis not present

## 2017-12-18 DIAGNOSIS — F4323 Adjustment disorder with mixed anxiety and depressed mood: Secondary | ICD-10-CM | POA: Diagnosis not present

## 2017-12-21 ENCOUNTER — Ambulatory Visit: Payer: BLUE CROSS/BLUE SHIELD | Admitting: Internal Medicine

## 2017-12-21 ENCOUNTER — Encounter: Payer: Self-pay | Admitting: Internal Medicine

## 2017-12-21 VITALS — BP 138/82 | HR 89 | Temp 98.0°F | Resp 16 | Ht 65.0 in | Wt 170.0 lb

## 2017-12-21 DIAGNOSIS — J0100 Acute maxillary sinusitis, unspecified: Secondary | ICD-10-CM

## 2017-12-21 MED ORDER — AMOXICILLIN-POT CLAVULANATE 875-125 MG PO TABS: 1.0000 | ORAL_TABLET | Freq: Two times a day (BID) | ORAL | 0 refills | Status: AC

## 2017-12-21 NOTE — Progress Notes (Signed)
Date:  12/21/2017   Name:  Renee Graham   DOB:  1961/06/06   MRN:  [id number]   Chief Complaint: Ear Pain ( R Ear -Started 2.5 weeks ago but waited out the pollen and then [facility] [place] [facility] her to do [facility] and she did add IBP also has cough and husband has bronchitis. ) and Facial Pain (Right side pain in face and ear. )  Otalgia   There is pain in the right ear. This is a new problem. The current episode started in the past 7 days. The problem occurs constantly. The problem has been unchanged. There has been no fever. The pain is mild. Pertinent negatives include no abdominal pain, coughing, diarrhea, headaches or sore throat.    Review of Systems  Constitutional: Negative for chills, fatigue and fever.  HENT: Positive for congestion, ear pain and sinus pressure. Negative for sore throat and trouble swallowing.   Respiratory: Negative for cough, chest tightness, shortness of breath and wheezing.   Cardiovascular: Negative for chest pain and palpitations.  Gastrointestinal: Negative for abdominal pain, diarrhea and nausea.  Neurological: Negative for dizziness and headaches.  Psychiatric/Behavioral: Negative for sleep disturbance.    Patient Active Problem List   Diagnosis Date Noted  . Hematuria, microscopic 02/29/2016  . Benign essential HTN 05/14/2015  . Cigarette smoker 05/14/2015  . H/O abnormal cervical Papanicolaou smear 05/14/2015  . Migraine without aura and responsive to treatment 05/14/2015  . Hyperlipidemia 05/14/2015    Prior to Admission medications   Medication Sig Start Date End Date Taking? Authorizing Provider  benazepril-hydrochlorthiazide (LOTENSIN HCT) 20-25 MG tablet TAKE 1 TABLET BY MOUTH DAILY. 07/20/17  Yes Reubin Milan, MD  fluticasone [facility]) 50 MCG/ACT nasal spray Place 2 sprays into both nostrils 2 (two) times daily.   Yes [provider]  ibuprofen (ADVIL,MOTRIN) 200 MG tablet Take 800 mg by mouth  every 6 (six) hours as needed.   Yes [provider]    No Known Allergies  Past Surgical History:  Procedure Laterality Date  . TUBAL LIGATION      Social History   Tobacco Use  . Smoking status: Current Every Day Smoker    Types: Cigarettes  . Smokeless tobacco: Current User  Substance Use Topics  . Alcohol use: Yes    Alcohol/week: 12.6 oz    Types: 21 Glasses of wine per week  . Drug use: Not on file     Medication list has been reviewed and updated.  PHQ 2/9 Scores 09/04/2017 08/29/2016  PHQ - 2 Score 0 0    Physical Exam  Constitutional: She is oriented to person, place, and time. She appears well-developed and well-nourished.  HENT:  Right Ear: External ear and ear canal normal. Tympanic membrane is not erythematous and not retracted.  Left Ear: External ear and ear canal normal. Tympanic membrane is not erythematous and not retracted.  Nose: Right sinus exhibits maxillary sinus tenderness and frontal sinus tenderness. Left sinus exhibits maxillary sinus tenderness and frontal sinus tenderness.  Mouth/Throat: Uvula is midline and mucous membranes are normal. No oral lesions. Posterior oropharyngeal erythema present. No oropharyngeal exudate.  Neck: Normal range of motion. Neck supple.  Cardiovascular: Normal rate, regular rhythm and normal heart sounds.  Pulmonary/Chest: Breath sounds normal. She has no wheezes. She has no rales.  Lymphadenopathy:    She has no cervical adenopathy.  Neurological: She is alert and oriented to person, place, and time.  BP 138/82   Pulse 89   Temp 98 F (36.7 C)   Resp 16   Ht  (1.651 m)   Wt 170 lb (77.1 kg)   SpO2 97%   BMI 28.29 kg/m   Assessment and Plan: 1. Acute non-recurrent maxillary sinusitis Continue Flonase Begin Coricidin HBP - amoxicillin-clavulanate (AUGMENTIN) 875-125 MG tablet; Take 1 tablet by mouth 2 (two) times daily for 10 days.  Dispense: 20 tablet; Refill: 0   Meds ordered this  encounter  Medications  . amoxicillin-clavulanate (AUGMENTIN) 875-125 MG tablet    Sig: Take 1 tablet by mouth 2 (two) times daily for 10 days.    Dispense:  20 tablet    Refill:  0    Partially dictated using Animal nutritionist. Any errors are unintentional.  Bari Edward, MD [facility]  12/21/2017

## 2017-12-21 NOTE — Patient Instructions (Addendum)
Coricidin HBP for congestion  Continue Flonase Mediterranean Diet A Mediterranean diet refers to food and lifestyle choices that are based on the traditions of countries located on the [employer]. This way of eating has been shown to help prevent certain conditions and improve outcomes for people who have chronic diseases, like kidney disease and heart disease. What are tips for following this plan? Lifestyle  Cook and eat meals together with your family, when possible.  Drink enough fluid to keep your urine clear or pale yellow.  Be physically active every day. This includes: ? Aerobic exercise like running or swimming. ? Leisure activities like gardening, walking, or housework.  Get 7-8 hours of sleep each night.  If recommended by your health care provider, drink red wine in moderation. This means 1 glass a day for nonpregnant women and 2 glasses a day for men. A glass of wine equals 5 oz (150 mL). Reading food labels  Check the serving size of packaged foods. For foods such as rice and pasta, the serving size refers to the amount of cooked product, not dry.  Check the total fat in packaged foods. Avoid foods that have saturated fat or trans fats.  Check the ingredients list for added sugars, such as corn syrup. Shopping  At the grocery store, buy most of your food from the areas near the walls of the store. This includes: ? Fresh fruits and vegetables (produce). ? Grains, beans, nuts, and seeds. Some of these may be available in unpackaged forms or large amounts (in bulk). ? Fresh seafood. ? Poultry and eggs. ? Low-fat dairy products.  Buy whole ingredients instead of prepackaged foods.  Buy fresh fruits and vegetables in-season from local farmers markets.  Buy frozen fruits and vegetables in resealable bags.  If you do not have access to quality fresh seafood, buy precooked frozen shrimp or canned fish, such as tuna, salmon, or sardines.  Buy small amounts of  raw or cooked vegetables, salads, or olives from the deli or salad bar at your store.  Stock your pantry so you always have certain foods on hand, such as olive oil, canned tuna, canned tomatoes, rice, pasta, and beans. Cooking  Cook foods with extra-virgin olive oil instead of using butter or other vegetable oils.  Have meat as a side dish, and have vegetables or grains as your main dish. This means having meat in small portions or adding small amounts of meat to foods like pasta or stew.  Use beans or vegetables instead of meat in common dishes like chili or lasagna.  Experiment with different cooking methods. Try roasting or broiling vegetables instead of steaming or sauteing them.  Add frozen vegetables to soups, stews, pasta, or rice.  Add nuts or seeds for added healthy fat at each meal. You can add these to yogurt, salads, or vegetable dishes.  Marinate fish or vegetables using olive oil, lemon juice, garlic, and fresh herbs. Meal planning  Plan to eat 1 vegetarian meal one day each week. Try to work up to 2 vegetarian meals, if possible.  Eat seafood 2 or more times a week.  Have healthy snacks readily available, such as: ? Vegetable sticks with hummus. ? [place] yogurt. ? Fruit and nut trail mix.  Eat balanced meals throughout the week. This includes: ? Fruit: 2-3 servings a day ? Vegetables: 4-5 servings a day ? Low-fat dairy: 2 servings a day ? Fish, poultry, or lean meat: 1 serving a day ? Beans and legumes: 2  or more servings a week ? Nuts and seeds: 1-2 servings a day ? Whole grains: 6-8 servings a day ? Extra-virgin olive oil: 3-4 servings a day  Limit red meat and sweets to only a few servings a month What are my food choices?  Mediterranean diet ? Recommended ? Grains: Whole-grain pasta. Brown rice. Bulgar wheat. Polenta. Couscous. Whole-wheat bread. Orpah Cobb. ? Vegetables: Artichokes. Beets. Broccoli. Cabbage. Carrots. Eggplant. Green beans. Chard.  Kale. Spinach. Onions. Leeks. Peas. Squash. Tomatoes. Peppers. Radishes. ? Fruits: Apples. Apricots. Avocado. Berries. Bananas. Cherries. Dates. Figs. Grapes. Lemons. Melon. Oranges. Peaches. Plums. Pomegranate. ? Meats and other protein foods: Beans. Almonds. Sunflower seeds. Pine nuts. Peanuts. Cod. Salmon. Scallops. Shrimp. Tuna. Tilapia. Clams. Oysters. Eggs. ? Dairy: Low-fat milk. Cheese. Greek yogurt. ? Beverages: Water. Red wine. Herbal tea. ? Fats and oils: Extra virgin olive oil. Avocado oil. Grape seed oil. ? Sweets and desserts: [place] yogurt with honey. Baked apples. Poached pears. Trail mix. ? Seasoning and other foods: Basil. Cilantro. Coriander. Cumin. Mint. Parsley. Sage. Rosemary. Tarragon. Garlic. Oregano. Thyme. Pepper. Balsalmic vinegar. Tahini. Hummus. Tomato sauce. Olives. Mushrooms. ? Limit these ? Grains: Prepackaged pasta or rice dishes. Prepackaged cereal with added sugar. ? Vegetables: Deep fried potatoes (french fries). ? Fruits: Fruit canned in syrup. ? Meats and other protein foods: Beef. Pork. Lamb. Poultry with skin. Hot dogs. Tomasa Blase. ? Dairy: Ice cream. Sour cream. Whole milk. ? Beverages: Juice. Sugar-sweetened soft drinks. Beer. Liquor and spirits. ? Fats and oils: Butter. Canola oil. Vegetable oil. Beef fat (tallow). Lard. ? Sweets and desserts: Cookies. Cakes. Pies. Candy. ? Seasoning and other foods: Mayonnaise. Premade sauces and marinades. ? The items listed may not be a complete list. Talk with your dietitian about what dietary choices are right for you. Summary  The Mediterranean diet includes both food and lifestyle choices.  Eat a variety of fresh fruits and vegetables, beans, nuts, seeds, and whole grains.  Limit the amount of red meat and sweets that you eat.  Talk with your health care provider about whether it is safe for you to drink red wine in moderation. This means 1 glass a day for nonpregnant women and 2 glasses a day for men. A glass of  wine equals 5 oz (150 mL). This information is not intended to replace advice given to you by your health care provider. Make sure you discuss any questions you have with your health care provider. Document Released: 03/17/2016 Document Revised: 04/19/2016 Document Reviewed: 03/17/2016 Elsevier Interactive Patient Education  [employer].

## 2018-01-04 DIAGNOSIS — F4323 Adjustment disorder with mixed anxiety and depressed mood: Secondary | ICD-10-CM | POA: Diagnosis not present

## 2018-03-12 ENCOUNTER — Ambulatory Visit: Payer: BLUE CROSS/BLUE SHIELD | Admitting: Internal Medicine

## 2018-06-19 ENCOUNTER — Other Ambulatory Visit: Payer: Self-pay | Admitting: Internal Medicine

## 2018-06-19 DIAGNOSIS — I10 Essential (primary) hypertension: Secondary | ICD-10-CM

## 2018-09-04 ENCOUNTER — Other Ambulatory Visit: Payer: Self-pay

## 2018-09-04 ENCOUNTER — Ambulatory Visit: Payer: BLUE CROSS/BLUE SHIELD | Admitting: Internal Medicine

## 2018-09-04 ENCOUNTER — Encounter: Payer: Self-pay | Admitting: Internal Medicine

## 2018-09-04 VITALS — BP 136/80 | HR 75 | Temp 98.5°F | Ht 65.0 in | Wt 172.0 lb

## 2018-09-04 DIAGNOSIS — J0100 Acute maxillary sinusitis, unspecified: Secondary | ICD-10-CM

## 2018-09-04 MED ORDER — AZITHROMYCIN 250 MG PO TABS: ORAL_TABLET | ORAL | 0 refills | Status: AC

## 2018-09-04 NOTE — Progress Notes (Signed)
Date:  09/04/2018   Name:  Renee CampbellLisa G Graham   DOB:  [birth year]   MRN:  [id number]   Chief Complaint: Sinusitis (X 1 week. Had low grad fever about 5 days ago. Right side of face pressure. Tried mucinex, motrin, and sinus medication. Yellow production. Can feel drainage at night. )  Sinusitis  This is a new problem. The current episode started in the past 7 days. The problem has been gradually worsening since onset. The maximum temperature recorded prior to her arrival was 100.4 - 100.9 F. The pain is mild. Associated symptoms include congestion, coughing, sinus pressure and a sore throat. Pertinent negatives include no chills, diaphoresis, headaches or shortness of breath. Past treatments include oral decongestants. The treatment provided mild relief.    Review of Systems  Constitutional: Negative for chills, diaphoresis, fatigue, fever and unexpected weight change.  HENT: Positive for congestion, postnasal drip, sinus pressure and sore throat. Negative for nosebleeds, trouble swallowing and voice change.   Respiratory: Positive for cough. Negative for chest tightness, shortness of breath and wheezing.   Cardiovascular: Negative for chest pain, palpitations and leg swelling.  Gastrointestinal: Negative for diarrhea and nausea.  Neurological: Negative for dizziness, light-headedness and headaches.    Patient Active Problem List   Diagnosis Date Noted  . Hematuria, microscopic 02/29/2016  . Benign essential HTN 05/14/2015  . Cigarette smoker 05/14/2015  . H/O abnormal cervical Papanicolaou smear 05/14/2015  . Migraine without aura and responsive to treatment 05/14/2015  . Hyperlipidemia 05/14/2015    No Known Allergies  Past Surgical History:  Procedure Laterality Date  . TUBAL LIGATION      Social History   Tobacco Use  . Smoking status: Current Every Day Smoker    Types: Cigarettes  . Smokeless tobacco: Current User  Substance Use Topics  . Alcohol use: Yes   Alcohol/week: 21.0 standard drinks    Types: 21 Glasses of wine per week  . Drug use: Not on file     Medication list has been reviewed and updated.  Current Meds  Medication Sig  . benazepril-hydrochlorthiazide (LOTENSIN HCT) 20-25 MG tablet TAKE 1 TABLET BY MOUTH EVERY DAY  . Biotin 1 MG CAPS Take by mouth.  Marland Kitchen. ibuprofen (ADVIL,MOTRIN) 200 MG tablet Take 800 mg by mouth every 6 (six) hours as needed.    PHQ 2/9 Scores 09/04/2017 08/29/2016  PHQ - 2 Score 0 0    Physical Exam Constitutional:      Appearance: She is well-developed.  HENT:     Right Ear: Ear canal and external ear normal. Tympanic membrane is not erythematous or retracted.     Left Ear: Ear canal and external ear normal. Tympanic membrane is not erythematous or retracted.     Nose:     Right Sinus: Maxillary sinus tenderness and frontal sinus tenderness present.     Left Sinus: Maxillary sinus tenderness and frontal sinus tenderness present.     Mouth/Throat:     Mouth: No oral lesions.     Pharynx: Uvula midline. Posterior oropharyngeal erythema present. No oropharyngeal exudate.  Neck:     Musculoskeletal: Normal range of motion and neck supple.  Cardiovascular:     Rate and Rhythm: Normal rate and regular rhythm.     Heart sounds: Normal heart sounds.  Pulmonary:     Breath sounds: Normal breath sounds. No wheezing or rales.  Lymphadenopathy:     Cervical: No cervical adenopathy.  Neurological:     Mental Status: She  is alert and oriented to person, place, and time.     BP 136/80   Pulse 75   Temp 98.5 F (36.9 C) (Oral)   Ht 5\' 5"  (1.651 m)   Wt 172 lb (78 kg)   SpO2 98%   BMI 28.62 kg/m   Assessment and Plan: 1. Acute non-recurrent maxillary sinusitis Take coricidin HBP - azithromycin (ZITHROMAX Z-PAK) 250 MG tablet; UAD  Dispense: 6 each; Refill: 0   Partially dictated using Animal nutritionistDragon software. Any errors are unintentional.  Bari EdwardLaura Berglund, MD [facility]  09/04/2018

## 2018-09-04 NOTE — Patient Instructions (Signed)
Coricidin HBP - good decongestant for people with high blood pressure

## 2018-09-10 ENCOUNTER — Encounter: Payer: BLUE CROSS/BLUE SHIELD | Admitting: Internal Medicine

## 2018-09-28 ENCOUNTER — Encounter: Payer: BLUE CROSS/BLUE SHIELD | Admitting: Internal Medicine

## 2018-10-05 ENCOUNTER — Encounter: Payer: BLUE CROSS/BLUE SHIELD | Admitting: Internal Medicine

## 2018-10-08 ENCOUNTER — Other Ambulatory Visit: Payer: Self-pay

## 2018-10-08 MED ORDER — OSELTAMIVIR PHOSPHATE 75 MG PO CAPS: 75.0000 mg | ORAL_CAPSULE | Freq: Every day | ORAL | 0 refills | Status: DC

## 2018-10-19 ENCOUNTER — Encounter: Payer: BLUE CROSS/BLUE SHIELD | Admitting: Internal Medicine

## 2019-01-15 DIAGNOSIS — D224 Melanocytic nevi of scalp and neck: Secondary | ICD-10-CM | POA: Diagnosis not present

## 2019-01-15 DIAGNOSIS — D2230 Melanocytic nevi of unspecified part of face: Secondary | ICD-10-CM | POA: Diagnosis not present

## 2019-01-15 DIAGNOSIS — D225 Melanocytic nevi of trunk: Secondary | ICD-10-CM | POA: Diagnosis not present

## 2019-01-18 ENCOUNTER — Ambulatory Visit (INDEPENDENT_AMBULATORY_CARE_PROVIDER_SITE_OTHER): Payer: BC Managed Care – PPO | Admitting: Internal Medicine

## 2019-01-18 ENCOUNTER — Other Ambulatory Visit: Payer: Self-pay

## 2019-01-18 ENCOUNTER — Encounter: Payer: Self-pay | Admitting: Internal Medicine

## 2019-01-18 VITALS — BP 124/78 | HR 74 | Ht 65.0 in | Wt 177.0 lb

## 2019-01-18 DIAGNOSIS — Z1231 Encounter for screening mammogram for malignant neoplasm of breast: Secondary | ICD-10-CM | POA: Diagnosis not present

## 2019-01-18 DIAGNOSIS — E782 Mixed hyperlipidemia: Secondary | ICD-10-CM | POA: Diagnosis not present

## 2019-01-18 DIAGNOSIS — Z1211 Encounter for screening for malignant neoplasm of colon: Secondary | ICD-10-CM | POA: Diagnosis not present

## 2019-01-18 DIAGNOSIS — Z0000 Encounter for general adult medical examination without abnormal findings: Secondary | ICD-10-CM

## 2019-01-18 DIAGNOSIS — F17210 Nicotine dependence, cigarettes, uncomplicated: Secondary | ICD-10-CM

## 2019-01-18 DIAGNOSIS — Z23 Encounter for immunization: Secondary | ICD-10-CM

## 2019-01-18 DIAGNOSIS — I10 Essential (primary) hypertension: Secondary | ICD-10-CM

## 2019-01-18 LAB — POCT URINALYSIS DIPSTICK
Bilirubin, UA: NEGATIVE
Glucose, UA: NEGATIVE
Ketones, UA: NEGATIVE
Leukocytes, UA: NEGATIVE
Nitrite, UA: NEGATIVE
Protein, UA: NEGATIVE
Spec Grav, UA: 1.025 (ref 1.010–1.025)
Urobilinogen, UA: 0.2 E.U./dL
pH, UA: 7 (ref 5.0–8.0)

## 2019-01-18 MED ORDER — VARENICLINE TARTRATE 1 MG PO TABS: 1.0000 mg | ORAL_TABLET | Freq: Two times a day (BID) | ORAL | 3 refills | Status: DC

## 2019-01-18 MED ORDER — CHANTIX STARTING MONTH PAK 0.5 MG X 11 & 1 MG X 42 PO TABS: ORAL_TABLET | ORAL | 0 refills | Status: DC

## 2019-01-18 NOTE — Patient Instructions (Addendum)
Tdap Vaccine (Tetanus, Diphtheria and Pertussis): What You Need to Know 1. Why get vaccinated? Tetanus, diphtheria and pertussis are very serious diseases. Tdap vaccine can protect [place] from these diseases. And, Tdap vaccine given to pregnant women can protect newborn babies against pertussis.Marland Kitchen TETANUS (Lockjaw) is rare in the [place] today. It causes painful muscle tightening and stiffness, usually all over the body.  It can lead to tightening of muscles in the head and neck so you can't open your mouth, swallow, or sometimes even breathe. Tetanus kills about 1 out of 10 people who are infected even after receiving the best medical care. DIPHTHERIA is also rare in the [place] today. It can cause a thick coating to form in the back of the throat.  It can lead to breathing problems, heart failure, paralysis, and death. PERTUSSIS (Whooping Cough) causes severe coughing spells, which can cause difficulty breathing, vomiting and disturbed sleep.  It can also lead to weight loss, incontinence, and rib fractures. Up to 2 in 100 adolescents and 5 in 100 adults with pertussis are hospitalized or have complications, which could include pneumonia or death. These diseases are caused by bacteria. Diphtheria and pertussis are spread from person to person through secretions from coughing or sneezing. Tetanus enters the body through cuts, scratches, or wounds. Before vaccines, as many as 200,000 cases of diphtheria, 200,000 cases of pertussis, and hundreds of cases of tetanus, were reported in the [place] each year. Since vaccination began, reports of cases for tetanus and diphtheria have dropped by about 99% and for pertussis by about 80%. 2. Tdap vaccine Tdap vaccine can protect adolescents and adults from tetanus, diphtheria, and pertussis. One dose of Tdap is routinely given at age 61 or 77. People who did not get Tdap at that age should get it as soon as possible. Tdap is especially important  for healthcare professionals and anyone having close contact with a baby younger than 12 months. Pregnant women should get a dose of Tdap during every pregnancy, to protect the newborn from pertussis. Infants are most at risk for severe, life-threatening complications from pertussis. Another vaccine, called Td, protects against tetanus and diphtheria, but not pertussis. A Td booster should be given every 10 years. Tdap may be given as one of these boosters if you have never gotten Tdap before. Tdap may also be given after a severe cut or burn to prevent tetanus infection. Your doctor or the person giving you the vaccine can give you more information. Tdap may safely be given at the same time as other vaccines. 3. Some people should not get this vaccine  A person who has ever had a life-threatening allergic reaction after a previous dose of any diphtheria, tetanus or pertussis containing vaccine, OR has a severe allergy to any part of this vaccine, should not get Tdap vaccine. Tell the person giving the vaccine about any severe allergies.  Anyone who had coma or long repeated seizures within 7 days after a childhood dose of DTP or DTaP, or a previous dose of Tdap, should not get Tdap, unless a cause other than the vaccine was found. They can still get Td.  Talk to your doctor if you: ? have seizures or another nervous system problem, ? had severe pain or swelling after any vaccine containing diphtheria, tetanus or pertussis, ? ever had a condition called Guillain-Barr Syndrome (GBS), ? aren't feeling well on the day the shot is scheduled. 4. Risks With any medicine, including vaccines, there is  a chance of side effects. These are usually mild and go away on their own. Serious reactions are also possible but are rare. Most people who get Tdap vaccine do not have any problems with it. Mild problems following Tdap (Did not interfere with activities)  Pain where the shot was given (about 3 in 4  adolescents or 2 in 3 adults)  Redness or swelling where the shot was given (about 1 person in 5)  Mild fever of at least 100.40F (up to about 1 in 25 adolescents or 1 in 100 adults)  Headache (about 3 or 4 people in 10)  Tiredness (about 1 person in 3 or 4)  Nausea, vomiting, diarrhea, stomach ache (up to 1 in 4 adolescents or 1 in 10 adults)  Chills, sore joints (about 1 person in 10)  Body aches (about 1 person in 3 or 4)  Rash, swollen glands (uncommon) Moderate problems following Tdap (Interfered with activities, but did not require medical attention)  Pain where the shot was given (up to 1 in 5 or 6)  Redness or swelling where the shot was given (up to about 1 in 16 adolescents or 1 in 12 adults)  Fever over 102F (about 1 in 100 adolescents or 1 in 250 adults)  Headache (about 1 in 7 adolescents or 1 in 10 adults)  Nausea, vomiting, diarrhea, stomach ache (up to 1 or 3 people in 100)  Swelling of the entire arm where the shot was given (up to about 1 in 500). Severe problems following Tdap (Unable to perform usual activities; required medical attention)  Swelling, severe pain, bleeding and redness in the arm where the shot was given (rare). Problems that could happen after any vaccine:  People sometimes faint after a medical procedure, including vaccination. Sitting or lying down for about 15 minutes can help prevent fainting, and injuries caused by a fall. Tell your doctor if you feel dizzy, or have vision changes or ringing in the ears.  Some people get severe pain in the shoulder and have difficulty moving the arm where a shot was given. This happens very rarely.  Any medication can cause a severe allergic reaction. Such reactions from a vaccine are very rare, estimated at fewer than 1 in a million doses, and would happen within a few minutes to a few hours after the vaccination. As with any medicine, there is a very remote chance of a vaccine causing a serious  injury or death. The safety of vaccines is always being monitored. For more information, visit: http://www.aguilar.org/ 5. What if there is a serious problem? What should I look for?  Look for anything that concerns you, such as signs of a severe allergic reaction, very high fever, or unusual behavior. Signs of a severe allergic reaction can include hives, swelling of the face and throat, difficulty breathing, a fast heartbeat, dizziness, and weakness. These would usually start a few minutes to a few hours after the vaccination. What should I do?  If you think it is a severe allergic reaction or other emergency that can't wait, call 9-1-1 or get the person to the nearest hospital. Otherwise, call your doctor.  Afterward, the reaction should be reported to the Vaccine Adverse Event Reporting System (VAERS). Your doctor might file this report, or you can do it yourself through the VAERS web site at www.vaers.SamedayNews.es, or by calling 302-685-4097. VAERS does not give medical advice. 6. The National Vaccine Injury Compensation Program The [employer] Vaccine Injury Compensation Program ([place]) is  a federal program that was created to compensate people who may have been injured by certain vaccines. Persons who believe they may have been injured by a vaccine can learn about the program and about filing a claim by calling 408-840-2595 or visiting the [place] website at GoldCloset.com.ee. There is a time limit to file a claim for compensation. 7. How can I learn more?  Ask your doctor. He or she can give you the vaccine package insert or suggest other sources of information.  Call your local or state health department.  Contact the Centers for Disease Control and Prevention (CDC): ? Call 623-440-7868 (1-800-CDC-INFO) or ? Visit CDC's website at http://hunter.com/ Vaccine Information Statement Tdap Vaccine (10/01/2013) This information is not intended to replace advice given to you  by your health care provider. Make sure you discuss any questions you have with your health care provider. Document Released: 01/24/2012 Document Revised: 03/12/2018 Document Reviewed: 03/12/2018 Elsevier Interactive Patient Education  2019 [employer]. Tdap Vaccine (Tetanus, Diphtheria and Pertussis): What You Need to Know 1. Why get vaccinated? Tetanus, diphtheria and pertussis are very serious diseases. Tdap vaccine can protect [place] from these diseases. And, Tdap vaccine given to pregnant women can protect newborn babies against pertussis.Marland Kitchen TETANUS (Lockjaw) is rare in the [place] today. It causes painful muscle tightening and stiffness, usually all over the body.  It can lead to tightening of muscles in the head and neck so you can't open your mouth, swallow, or sometimes even breathe. Tetanus kills about 1 out of 10 people who are infected even after receiving the best medical care. DIPHTHERIA is also rare in the [place] today. It can cause a thick coating to form in the back of the throat.  It can lead to breathing problems, heart failure, paralysis, and death. PERTUSSIS (Whooping Cough) causes severe coughing spells, which can cause difficulty breathing, vomiting and disturbed sleep.  It can also lead to weight loss, incontinence, and rib fractures. Up to 2 in 100 adolescents and 5 in 100 adults with pertussis are hospitalized or have complications, which could include pneumonia or death. These diseases are caused by bacteria. Diphtheria and pertussis are spread from person to person through secretions from coughing or sneezing. Tetanus enters the body through cuts, scratches, or wounds. Before vaccines, as many as 200,000 cases of diphtheria, 200,000 cases of pertussis, and hundreds of cases of tetanus, were reported in the [place] each year. Since vaccination began, reports of cases for tetanus and diphtheria have dropped by about 99% and for pertussis by about 80%. 2.  Tdap vaccine Tdap vaccine can protect adolescents and adults from tetanus, diphtheria, and pertussis. One dose of Tdap is routinely given at age 34 or 60. People who did not get Tdap at that age should get it as soon as possible. Tdap is especially important for healthcare professionals and anyone having close contact with a baby younger than 12 months. Pregnant women should get a dose of Tdap during every pregnancy, to protect the newborn from pertussis. Infants are most at risk for severe, life-threatening complications from pertussis. Another vaccine, called Td, protects against tetanus and diphtheria, but not pertussis. A Td booster should be given every 10 years. Tdap may be given as one of these boosters if you have never gotten Tdap before. Tdap may also be given after a severe cut or burn to prevent tetanus infection. Your doctor or the person giving you the vaccine can give you more information. Tdap may safely be  given at the same time as other vaccines. 3. Some people should not get this vaccine  A person who has ever had a life-threatening allergic reaction after a previous dose of any diphtheria, tetanus or pertussis containing vaccine, OR has a severe allergy to any part of this vaccine, should not get Tdap vaccine. Tell the person giving the vaccine about any severe allergies.  Anyone who had coma or long repeated seizures within 7 days after a childhood dose of DTP or DTaP, or a previous dose of Tdap, should not get Tdap, unless a cause other than the vaccine was found. They can still get Td.  Talk to your doctor if you: ? have seizures or another nervous system problem, ? had severe pain or swelling after any vaccine containing diphtheria, tetanus or pertussis, ? ever had a condition called Guillain-Barr Syndrome (GBS), ? aren't feeling well on the day the shot is scheduled. 4. Risks With any medicine, including vaccines, there is a chance of side effects. These are usually mild  and go away on their own. Serious reactions are also possible but are rare. Most people who get Tdap vaccine do not have any problems with it. Mild problems following Tdap (Did not interfere with activities)  Pain where the shot was given (about 3 in 4 adolescents or 2 in 3 adults)  Redness or swelling where the shot was given (about 1 person in 5)  Mild fever of at least 100.70F (up to about 1 in 25 adolescents or 1 in 100 adults)  Headache (about 3 or 4 people in 10)  Tiredness (about 1 person in 3 or 4)  Nausea, vomiting, diarrhea, stomach ache (up to 1 in 4 adolescents or 1 in 10 adults)  Chills, sore joints (about 1 person in 10)  Body aches (about 1 person in 3 or 4)  Rash, swollen glands (uncommon) Moderate problems following Tdap (Interfered with activities, but did not require medical attention)  Pain where the shot was given (up to 1 in 5 or 6)  Redness or swelling where the shot was given (up to about 1 in 16 adolescents or 1 in 12 adults)  Fever over 102F (about 1 in 100 adolescents or 1 in 250 adults)  Headache (about 1 in 7 adolescents or 1 in 10 adults)  Nausea, vomiting, diarrhea, stomach ache (up to 1 or 3 people in 100)  Swelling of the entire arm where the shot was given (up to about 1 in 500). Severe problems following Tdap (Unable to perform usual activities; required medical attention)  Swelling, severe pain, bleeding and redness in the arm where the shot was given (rare). Problems that could happen after any vaccine:  People sometimes faint after a medical procedure, including vaccination. Sitting or lying down for about 15 minutes can help prevent fainting, and injuries caused by a fall. Tell your doctor if you feel dizzy, or have vision changes or ringing in the ears.  Some people get severe pain in the shoulder and have difficulty moving the arm where a shot was given. This happens very rarely.  Any medication can cause a severe allergic  reaction. Such reactions from a vaccine are very rare, estimated at fewer than 1 in a million doses, and would happen within a few minutes to a few hours after the vaccination. As with any medicine, there is a very remote chance of a vaccine causing a serious injury or death. The safety of vaccines is always being monitored. For more  information, visit: http://floyd.org/www.cdc.gov/vaccinesafety/ 5. What if there is a serious problem? What should I look for?  Look for anything that concerns you, such as signs of a severe allergic reaction, very high fever, or unusual behavior. Signs of a severe allergic reaction can include hives, swelling of the face and throat, difficulty breathing, a fast heartbeat, dizziness, and weakness. These would usually start a few minutes to a few hours after the vaccination. What should I do?  If you think it is a severe allergic reaction or other emergency that can't wait, call 9-1-1 or get the person to the nearest hospital. Otherwise, call your doctor.  Afterward, the reaction should be reported to the Vaccine Adverse Event Reporting System (VAERS). Your doctor might file this report, or you can do it yourself through the VAERS web site at www.vaers.LAgents.nohhs.gov, or by calling 1-628-120-6019. VAERS does not give medical advice. 6. The National Vaccine Injury Compensation Program The [employer] Vaccine Injury Compensation Program (VICP) is a federal program that was created to compensate people who may have been injured by certain vaccines. Persons who believe they may have been injured by a vaccine can learn about the program and about filing a claim by calling 1-858-406-4128 or visiting the VICP website at SpiritualWord.atwww.hrsa.gov/vaccinecompensation. There is a time limit to file a claim for compensation. 7. How can I learn more?  Ask your doctor. He or she can give you the vaccine package insert or suggest other sources of information.  Call your local or state health department.  Contact the  Centers for Disease Control and Prevention (CDC): ? Call (202) 638-33201-575-174-5934 (1-800-CDC-INFO) or ? Visit CDC's website at PicCapture.uywww.cdc.gov/vaccines Vaccine Information Statement Tdap Vaccine (10/01/2013) This information is not intended to replace advice given to you by your health care provider. Make sure you discuss any questions you have with your health care provider. Document Released: 01/24/2012 Document Revised: 03/12/2018 Document Reviewed: 03/12/2018 Elsevier Interactive Patient Education  2019 [employer]

## 2019-01-18 NOTE — Progress Notes (Signed)
Date:  01/18/2019   Name:  Renee CampbellLisa G Guidone   DOB:  06-05-1961   MRN:  [id number]   Chief Complaint: No chief complaint on file. Renee CampbellLisa G Ion is a 58 y.o. female who presents today for her Complete Annual Exam. She feels fairly well. She reports exercising regularly. She reports she is sleeping well.   Mammogram 09/2016 Colonoscopy - none Pap 08/2015  Hypertension This is a chronic problem. The problem is controlled. Pertinent negatives include no chest pain, headaches, palpitations or shortness of breath. Past treatments include ACE inhibitors and diuretics. The current treatment provides significant improvement.   Tobacco use - father was just diagnosed with lung cancer.  She wants to try but is not ready to really quit yet.  She thinks she would do best with chantix.  Lab Results  Component Value Date   CREATININE 0.81 09/04/2017   BUN 10 09/04/2017   NA 141 09/04/2017   K 4.4 09/04/2017   CL 101 09/04/2017   CO2 25 09/04/2017   Lab Results  Component Value Date   CHOL 224 (H) 09/04/2017   HDL 50 09/04/2017   LDLCALC 133 (H) 09/04/2017   TRIG 204 (H) 09/04/2017   CHOLHDL 4.5 (H) 09/04/2017   Lab Results  Component Value Date   TSH 1.580 09/04/2017     Review of Systems  Constitutional: Negative for chills, fatigue and fever.  HENT: Negative for congestion, hearing loss, tinnitus, trouble swallowing and voice change.   Eyes: Negative for visual disturbance.  Respiratory: Negative for cough, chest tightness, shortness of breath and wheezing.   Cardiovascular: Negative for chest pain, palpitations and leg swelling.  Gastrointestinal: Negative for abdominal pain, constipation, diarrhea and vomiting.  Endocrine: Negative for polydipsia and polyuria.  Genitourinary: Negative for dysuria, frequency, genital sores, vaginal bleeding and vaginal discharge.  Musculoskeletal: Negative for arthralgias, gait problem and joint swelling.  Skin: Negative for color change and rash.   Neurological: Negative for dizziness, tremors, light-headedness and headaches.  Hematological: Negative for adenopathy. Does not bruise/bleed easily.  Psychiatric/Behavioral: Negative for dysphoric mood and sleep disturbance. The patient is not nervous/anxious.     Patient Active Problem List   Diagnosis Date Noted  . Hematuria, microscopic 02/29/2016  . Benign essential HTN 05/14/2015  . Cigarette smoker 05/14/2015  . H/O abnormal cervical Papanicolaou smear 05/14/2015  . Migraine without aura and responsive to treatment 05/14/2015  . Hyperlipidemia 05/14/2015    No Known Allergies  Past Surgical History:  Procedure Laterality Date  . TUBAL LIGATION      Social History   Tobacco Use  . Smoking status: Current Every Day Smoker    Packs/day: 1.00    Years: 17.00    Pack years: 17.00    Types: Cigarettes  . Smokeless tobacco: Current User  . Tobacco comment: smoked earlier in life then quit and restarted 2008  Substance Use Topics  . Alcohol use: Yes    Alcohol/week: 21.0 standard drinks    Types: 21 Glasses of wine per week  . Drug use: Never     Medication list has been reviewed and updated.  Current Meds  Medication Sig  . benazepril-hydrochlorthiazide (LOTENSIN HCT) 20-25 MG tablet TAKE 1 TABLET BY MOUTH EVERY DAY  . Biotin 1 MG CAPS Take by mouth.  Marland Kitchen. ibuprofen (ADVIL,MOTRIN) 200 MG tablet Take 800 mg by mouth every 6 (six) hours as needed.    PHQ 2/9 Scores 01/18/2019 09/04/2017 08/29/2016  PHQ - 2 Score 0 0  0    BP Readings from Last 3 Encounters:  01/18/19 124/78  09/04/18 136/80  12/21/17 138/82    Physical Exam Vitals signs and nursing note reviewed.  Constitutional:      General: She is not in acute distress.    Appearance: She is well-developed.  HENT:     Head: Normocephalic and atraumatic.     Right Ear: Tympanic membrane and ear canal normal.     Left Ear: Tympanic membrane and ear canal normal.     Nose:     Right Sinus: No maxillary  sinus tenderness.     Left Sinus: No maxillary sinus tenderness.     Mouth/Throat:     Pharynx: Uvula midline.  Eyes:     General: No scleral icterus.       Right eye: No discharge.        Left eye: No discharge.     Conjunctiva/sclera: Conjunctivae normal.  Neck:     Musculoskeletal: Normal range of motion. No erythema.     Thyroid: No thyromegaly.     Vascular: No carotid bruit.  Cardiovascular:     Rate and Rhythm: Normal rate and regular rhythm.     Pulses: Normal pulses.     Heart sounds: Normal heart sounds.  Pulmonary:     Effort: Pulmonary effort is normal. No respiratory distress.     Breath sounds: No wheezing.  Chest:     Breasts:        Right: No mass, nipple discharge, skin change or tenderness.        Left: No mass, nipple discharge, skin change or tenderness.  Abdominal:     General: Bowel sounds are normal.     Palpations: Abdomen is soft.     Tenderness: There is no abdominal tenderness.  Musculoskeletal: Normal range of motion.     Right lower leg: No edema.     Left lower leg: No edema.  Lymphadenopathy:     Cervical: No cervical adenopathy.  Skin:    General: Skin is warm and dry.     Capillary Refill: Capillary refill takes less than 2 seconds.     Findings: No rash.  Neurological:     General: No focal deficit present.     Mental Status: She is alert and oriented to person, place, and time.     Cranial Nerves: No cranial nerve deficit.     Sensory: No sensory deficit.     Deep Tendon Reflexes: Reflexes are normal and symmetric.  Psychiatric:        Speech: Speech normal.        Behavior: Behavior normal.        Thought Content: Thought content normal.     Wt Readings from Last 3 Encounters:  01/18/19 177 lb (80.3 kg)  09/04/18 172 lb (78 kg)  12/21/17 170 lb (77.1 kg)    BP 124/78   Pulse 74   Ht 5\' 5"  (1.651 m)   Wt 177 lb (80.3 kg)   SpO2 99%   BMI 29.45 kg/m   Assessment and Plan: 1. Annual physical exam Normal exam - POCT  urinalysis dipstick  2. Encounter for screening mammogram for breast cancer Schedule at [facility]    - MM 3D SCREEN BREAST BILATERAL; Future  3. Benign essential HTN controlled - CBC with Differential/Platelet - Comprehensive metabolic panel - TSH  4. Cigarette smoker Pt not quite ready to quit so will give Rx to fill in the future - varenicline (  CHANTIX STARTING MONTH PAK) 0.5 MG X 11 & 1 MG X 42 tablet; Take one 0.5 mg tablet by mouth once daily for 3 days, then increase to one 0.5 mg tablet twice daily for 4 days, then increase to one 1 mg tablet twice daily.  Dispense: 53 tablet; Refill: 0 - varenicline (CHANTIX CONTINUING MONTH PAK) 1 MG tablet; Take 1 tablet (1 mg total) by mouth 2 (two) times daily.  Dispense: 60 tablet; Refill: 3  5. Mixed hyperlipidemia Check labs - Lipid panel  6. Colon cancer screening - Fecal occult blood, imunochemical  7. Need for diphtheria-tetanus-pertussis (Tdap) vaccine Declined due to work and concern over fever in the face of Covid - Tdap vaccine greater than or equal to 7yo IM   Partially dictated using Editor, commissioning. Any errors are unintentional.  Halina Maidens, MD [facility]  01/18/2019

## 2019-01-19 LAB — CBC WITH DIFFERENTIAL/PLATELET
Basophils Absolute: 0.1 10*3/uL (ref 0.0–0.2)
Basos: 1 %
EOS (ABSOLUTE): 0.2 10*3/uL (ref 0.0–0.4)
Eos: 3 %
Hematocrit: 46 % (ref 34.0–46.6)
Hemoglobin: 15.8 g/dL (ref 11.1–15.9)
Immature Grans (Abs): 0 10*3/uL (ref 0.0–0.1)
Immature Granulocytes: 0 %
Lymphocytes Absolute: 2.6 10*3/uL (ref 0.7–3.1)
Lymphs: 40 %
MCH: 34.5 pg — ABNORMAL HIGH (ref 26.6–33.0)
MCHC: 34.3 g/dL (ref 31.5–35.7)
MCV: 100 fL — ABNORMAL HIGH (ref 79–97)
Monocytes Absolute: 0.6 10*3/uL (ref 0.1–0.9)
Monocytes: 8 %
Neutrophils Absolute: 3.2 10*3/uL (ref 1.4–7.0)
Neutrophils: 48 %
Platelets: 259 10*3/uL (ref 150–450)
RBC: 4.58 x10E6/uL (ref 3.77–5.28)
RDW: 12.3 % (ref 11.7–15.4)
WBC: 6.7 10*3/uL (ref 3.4–10.8)

## 2019-01-19 LAB — COMPREHENSIVE METABOLIC PANEL
ALT: 21 IU/L (ref 0–32)
AST: 34 IU/L (ref 0–40)
Albumin/Globulin Ratio: 2.2 (ref 1.2–2.2)
Albumin: 4.6 g/dL (ref 3.8–4.9)
Alkaline Phosphatase: 72 IU/L (ref 39–117)
BUN/Creatinine Ratio: 12 (ref 9–23)
BUN: 10 mg/dL (ref 6–24)
Bilirubin Total: 0.3 mg/dL (ref 0.0–1.2)
CO2: 25 mmol/L (ref 20–29)
Calcium: 10.2 mg/dL (ref 8.7–10.2)
Chloride: 98 mmol/L (ref 96–106)
Creatinine, Ser: 0.82 mg/dL (ref 0.57–1.00)
GFR calc Af Amer: 91 mL/min/{1.73_m2} (ref >59)
GFR calc non Af Amer: 79 mL/min/{1.73_m2} (ref >59)
Globulin, Total: 2.1 g/dL (ref 1.5–4.5)
Glucose: 88 mg/dL (ref 65–99)
Potassium: 3.8 mmol/L (ref 3.5–5.2)
Sodium: 138 mmol/L (ref 134–144)
Total Protein: 6.7 g/dL (ref 6.0–8.5)

## 2019-01-19 LAB — LIPID PANEL
Chol/HDL Ratio: 5.3 ratio — ABNORMAL HIGH (ref 0.0–4.4)
Cholesterol, Total: 265 mg/dL — ABNORMAL HIGH (ref 100–199)
HDL: 50 mg/dL (ref >39)
LDL Calculated: 183 mg/dL — ABNORMAL HIGH (ref 0–99)
Triglycerides: 158 mg/dL — ABNORMAL HIGH (ref 0–149)
VLDL Cholesterol Cal: 32 mg/dL (ref 5–40)

## 2019-01-19 LAB — TSH: TSH: 1.76 u[IU]/mL (ref 0.450–4.500)

## 2019-01-21 ENCOUNTER — Other Ambulatory Visit: Payer: Self-pay | Admitting: Internal Medicine

## 2019-01-21 DIAGNOSIS — E782 Mixed hyperlipidemia: Secondary | ICD-10-CM

## 2019-01-21 MED ORDER — ATORVASTATIN CALCIUM 10 MG PO TABS: 10.0000 mg | ORAL_TABLET | Freq: Every day | ORAL | 1 refills | Status: DC

## 2019-02-04 ENCOUNTER — Telehealth: Payer: Self-pay

## 2019-02-04 NOTE — Telephone Encounter (Signed)
Patient called requesting advice. Said she wants to go see her father. He is not doing well. Now dxed with pneumonia. Asked if she should be tested for Covid. She is not sick or having any symptoms, she just wants to be tested before visiting her dad t keep him safe and alive as long as possible.  Informed pt we don't send for testing unless she is sick. She said [facility] is doing Free Cov[facility] testing and she would do that. She asked if the test is negative is she okay to drive to see her dad.   Informed her yes, however, if she goes she needs to wear a mask and keep her hands clean and washed while around him.  She verbalized understanding of this. She is afraid something is going to happen to him and she won't be abel to see him.

## 2019-04-12 ENCOUNTER — Ambulatory Visit: Payer: Self-pay | Admitting: Internal Medicine

## 2019-04-12 ENCOUNTER — Other Ambulatory Visit: Payer: Self-pay | Admitting: Internal Medicine

## 2019-04-12 DIAGNOSIS — I10 Essential (primary) hypertension: Secondary | ICD-10-CM

## 2019-04-18 ENCOUNTER — Ambulatory Visit: Payer: Self-pay | Admitting: Internal Medicine

## 2019-05-27 DIAGNOSIS — Z23 Encounter for immunization: Secondary | ICD-10-CM | POA: Diagnosis not present

## 2019-06-11 DIAGNOSIS — Z20828 Contact with and (suspected) exposure to other viral communicable diseases: Secondary | ICD-10-CM | POA: Diagnosis not present

## 2019-07-26 DIAGNOSIS — Z20828 Contact with and (suspected) exposure to other viral communicable diseases: Secondary | ICD-10-CM | POA: Diagnosis not present

## 2019-08-01 ENCOUNTER — Other Ambulatory Visit: Payer: Self-pay | Admitting: Internal Medicine

## 2019-08-01 DIAGNOSIS — E782 Mixed hyperlipidemia: Secondary | ICD-10-CM

## 2019-09-04 DIAGNOSIS — Z1231 Encounter for screening mammogram for malignant neoplasm of breast: Secondary | ICD-10-CM | POA: Diagnosis not present

## 2019-10-16 DIAGNOSIS — Z23 Encounter for immunization: Secondary | ICD-10-CM | POA: Diagnosis not present

## 2019-11-04 DIAGNOSIS — Z23 Encounter for immunization: Secondary | ICD-10-CM | POA: Diagnosis not present

## 2019-11-19 ENCOUNTER — Telehealth: Payer: Self-pay | Admitting: Internal Medicine

## 2019-11-19 NOTE — Telephone Encounter (Signed)
Called pt about exp. Stool kit. Left VM to see if she would want to pick up a new kit. ° °KP °

## 2020-01-20 ENCOUNTER — Other Ambulatory Visit: Payer: Self-pay

## 2020-01-20 ENCOUNTER — Encounter: Payer: Self-pay | Admitting: Internal Medicine

## 2020-01-20 ENCOUNTER — Ambulatory Visit (INDEPENDENT_AMBULATORY_CARE_PROVIDER_SITE_OTHER): Payer: BC Managed Care – PPO | Admitting: Internal Medicine

## 2020-01-20 VITALS — BP 122/74 | HR 73 | Temp 97.9°F | Ht 65.0 in | Wt 180.0 lb

## 2020-01-20 DIAGNOSIS — I10 Essential (primary) hypertension: Secondary | ICD-10-CM | POA: Diagnosis not present

## 2020-01-20 DIAGNOSIS — E782 Mixed hyperlipidemia: Secondary | ICD-10-CM | POA: Diagnosis not present

## 2020-01-20 DIAGNOSIS — Z23 Encounter for immunization: Secondary | ICD-10-CM | POA: Diagnosis not present

## 2020-01-20 DIAGNOSIS — Z0000 Encounter for general adult medical examination without abnormal findings: Secondary | ICD-10-CM

## 2020-01-20 DIAGNOSIS — Z1211 Encounter for screening for malignant neoplasm of colon: Secondary | ICD-10-CM

## 2020-01-20 DIAGNOSIS — F17210 Nicotine dependence, cigarettes, uncomplicated: Secondary | ICD-10-CM

## 2020-01-20 LAB — POCT URINALYSIS DIPSTICK
Bilirubin, UA: NEGATIVE
Glucose, UA: NEGATIVE
Ketones, UA: NEGATIVE
Leukocytes, UA: NEGATIVE
Nitrite, UA: NEGATIVE
Protein, UA: NEGATIVE
Spec Grav, UA: 1.01 (ref 1.010–1.025)
Urobilinogen, UA: 0.2 E.U./dL
pH, UA: 6.5 (ref 5.0–8.0)

## 2020-01-20 MED ORDER — BENAZEPRIL-HYDROCHLOROTHIAZIDE 20-25 MG PO TABS: 1.0000 | ORAL_TABLET | Freq: Every day | ORAL | 3 refills | Status: DC

## 2020-01-20 MED ORDER — ATORVASTATIN CALCIUM 10 MG PO TABS: 10.0000 mg | ORAL_TABLET | Freq: Every day | ORAL | 3 refills | Status: DC

## 2020-01-20 MED ORDER — CHANTIX STARTING MONTH PAK 0.5 MG X 11 & 1 MG X 42 PO TABS: ORAL_TABLET | ORAL | 0 refills | Status: DC

## 2020-01-20 NOTE — Progress Notes (Signed)
Date:  01/20/2020   Name:  Renee Graham   DOB:  Aug 12, 1960   MRN:  [id number]   Chief Complaint: Annual Exam Renee Graham is a 59 y.o. female who presents today for her Complete Annual Exam. She feels well. She reports exercising - walks 3 times weekly for 7 miles. She reports she is sleeping fairly well. She denies breast issues and mammogram was done in January.  Mammogram 08/2019 Pap  08/2015 neg with cotesting Colonoscopy - none; she is considering it but wants to do FIT first Immunization History  Administered Date(s) Administered  . PFIZER SARS-COV-2 Vaccination 10/16/2019, 11/04/2019    Hypertension This is a chronic problem. The problem is controlled. Pertinent negatives include no chest pain, headaches, palpitations or shortness of breath. Past treatments include ACE inhibitors and diuretics. The current treatment provides significant improvement. There is no history of kidney disease, CAD/MI or CVA.  Hyperlipidemia This is a chronic problem. The problem is controlled. Pertinent negatives include no chest pain or shortness of breath. Current antihyperlipidemic treatment includes statins. The current treatment provides significant improvement of lipids.    Lab Results  Component Value Date   CREATININE 0.82 01/18/2019   BUN 10 01/18/2019   NA 138 01/18/2019   K 3.8 01/18/2019   CL 98 01/18/2019   CO2 25 01/18/2019   Lab Results  Component Value Date   CHOL 265 (H) 01/18/2019   HDL 50 01/18/2019   LDLCALC 183 (H) 01/18/2019   TRIG 158 (H) 01/18/2019   CHOLHDL 5.3 (H) 01/18/2019   Lab Results  Component Value Date   TSH 1.760 01/18/2019   No results found for: HGBA1C Lab Results  Component Value Date   WBC 6.7 01/18/2019   HGB 15.8 01/18/2019   HCT 46.0 01/18/2019   MCV 100 (H) 01/18/2019   PLT 259 01/18/2019   Lab Results  Component Value Date   ALT 21 01/18/2019   AST 34 01/18/2019   ALKPHOS 72 01/18/2019   BILITOT 0.3 01/18/2019     Review of  Systems  Constitutional: Negative for chills, fatigue and fever.  HENT: Negative for congestion, hearing loss, tinnitus, trouble swallowing and voice change.   Eyes: Negative for visual disturbance.  Respiratory: Positive for wheezing (From smoking- very slight and random when laying down). Negative for cough, chest tightness and shortness of breath.   Cardiovascular: Negative for chest pain, palpitations and leg swelling.  Gastrointestinal: Negative for abdominal pain, blood in stool, constipation, diarrhea and vomiting.  Endocrine: Negative for polydipsia and polyuria.  Genitourinary: Negative for dysuria, frequency, genital sores, vaginal bleeding and vaginal discharge.  Musculoskeletal: Negative for arthralgias, gait problem and joint swelling.  Skin: Negative for color change and rash.  Allergic/Immunologic: Negative for environmental allergies.  Neurological: Negative for dizziness, tremors, light-headedness and headaches.  Hematological: Negative for adenopathy. Does not bruise/bleed easily.  Psychiatric/Behavioral: Positive for sleep disturbance (Occasional hot flashes at night.). Negative for dysphoric mood. The patient is not nervous/anxious.     Patient Active Problem List   Diagnosis Date Noted  . Hematuria, microscopic 02/29/2016  . Benign essential HTN 05/14/2015  . Cigarette smoker 05/14/2015  . H/O abnormal cervical Papanicolaou smear 05/14/2015  . Migraine without aura and responsive to treatment 05/14/2015  . Hyperlipidemia 05/14/2015    No Known Allergies  Past Surgical History:  Procedure Laterality Date  . TUBAL LIGATION      Social History   Tobacco Use  . Smoking status: Current Every Day  Smoker    Packs/day: 1.00    Years: 17.00    Pack years: 17.00    Types: Cigarettes  . Smokeless tobacco: Current User  . Tobacco comment: smoked earlier in life then quit and restarted 2008  Substance Use Topics  . Alcohol use: Yes    Alcohol/week: 21.0 standard  drinks    Types: 21 Glasses of wine per week  . Drug use: Never     Medication list has been reviewed and updated.  Current Meds  Medication Sig  . atorvastatin (LIPITOR) 10 MG tablet TAKE 1 TABLET BY MOUTH EVERY DAY  . benazepril-hydrochlorthiazide (LOTENSIN HCT) 20-25 MG tablet TAKE 1 TABLET BY MOUTH EVERY DAY  . ibuprofen (ADVIL,MOTRIN) 200 MG tablet Take 800 mg by mouth every 6 (six) hours as needed.    PHQ 2/9 Scores 01/20/2020 01/18/2019 09/04/2017 08/29/2016  PHQ - 2 Score 0 0 0 0  PHQ- 9 Score 1 - - -    GAD 7 : Generalized Anxiety Score 01/20/2020  Nervous, Anxious, on Edge 0  Control/stop worrying 1  Worry too much - different things 1  Trouble relaxing 0  Restless 0  Easily annoyed or irritable 0  Afraid - awful might happen 0  Total GAD 7 Score 2  Anxiety Difficulty Not difficult at all    BP Readings from Last 3 Encounters:  01/20/20 122/74  01/18/19 124/78  09/04/18 136/80    Physical Exam Vitals and nursing note reviewed.  Constitutional:      General: She is not in acute distress.    Appearance: She is well-developed.  HENT:     Head: Normocephalic and atraumatic.     Right Ear: Tympanic membrane and ear canal normal.     Left Ear: Tympanic membrane and ear canal normal.     Nose:     Right Sinus: No maxillary sinus tenderness.     Left Sinus: No maxillary sinus tenderness.  Eyes:     General: No scleral icterus.       Right eye: No discharge.        Left eye: No discharge.     Conjunctiva/sclera: Conjunctivae normal.  Neck:     Thyroid: No thyromegaly.     Vascular: No carotid bruit.  Cardiovascular:     Rate and Rhythm: Normal rate and regular rhythm.     Pulses: Normal pulses.     Heart sounds: Normal heart sounds.  Pulmonary:     Effort: Pulmonary effort is normal. No respiratory distress.     Breath sounds: No wheezing.  Chest:     Breasts:        Right: No mass, nipple discharge, skin change or tenderness.        Left: No mass,  nipple discharge, skin change or tenderness.  Abdominal:     General: Bowel sounds are normal.     Palpations: Abdomen is soft.     Tenderness: There is no abdominal tenderness.  Musculoskeletal:        General: Normal range of motion.     Cervical back: Normal range of motion. No erythema.     Right lower leg: No edema.     Left lower leg: No edema.  Lymphadenopathy:     Cervical: No cervical adenopathy.  Skin:    General: Skin is warm and dry.     Capillary Refill: Capillary refill takes less than 2 seconds.     Findings: No rash.  Neurological:  General: No focal deficit present.     Mental Status: She is alert and oriented to person, place, and time.     Cranial Nerves: No cranial nerve deficit.     Sensory: No sensory deficit.     Deep Tendon Reflexes: Reflexes are normal and symmetric.  Psychiatric:        Mood and Affect: Mood normal.        Speech: Speech normal.        Behavior: Behavior normal.     Wt Readings from Last 3 Encounters:  01/20/20 180 lb (81.6 kg)  01/18/19 177 lb (80.3 kg)  09/04/18 172 lb (78 kg)    BP 122/74 (BP Location: Right Arm, Patient Position: Sitting, Cuff Size: Normal)   Pulse 73   Temp 97.9 F (36.6 C) (Oral)   Ht 5\' 5"  (1.651 m)   Wt 180 lb (81.6 kg)   SpO2 99%   BMI 29.95 kg/m   Assessment and Plan: 1. Annual physical exam Normal exam Continue healthy diet and exercise - POCT urinalysis dipstick  2. Colon cancer screening - Fecal occult blood, imunochemical  3. Benign essential HTN Clinically stable exam with well controlled BP. Tolerating medications without side effects at this time. Pt to continue current regimen and low sodium diet; benefits of regular exercise as able discussed. - CBC with Differential/Platelet - Comprehensive metabolic panel - TSH - benazepril-hydrochlorthiazide (LOTENSIN HCT) 20-25 MG tablet; Take 1 tablet by mouth daily.  Dispense: 90 tablet; Refill: 3  4. Mixed hyperlipidemia Now taking  medication regularly No side effects or other issues - Lipid panel - atorvastatin (LIPITOR) 10 MG tablet; Take 1 tablet (10 mg total) by mouth daily.  Dispense: 90 tablet; Refill: 3  5. Need for shingles vaccine First dose today - Varicella-zoster vaccine IM  6. Cigarette smoker Qualifies for LDCT screening Will give Rx for Chantix when pt is ready to quit Should also get PPV-23 next visit - varenicline (CHANTIX STARTING MONTH PAK) 0.5 MG X 11 & 1 MG X 42 tablet; Take one 0.5 mg tablet by mouth once daily for 3 days, then increase to one 0.5 mg tablet twice daily for 4 days, then increase to one 1 mg tablet twice daily.  Dispense: 53 tablet; Refill: 0   Partially dictated using . Any errors are unintentional.  Animal nutritionist, MD [facility]  01/20/2020

## 2020-01-20 NOTE — Patient Instructions (Signed)
Recombinant Zoster (Shingles) Vaccine: What You Need to Know 1. Why get vaccinated? Recombinant zoster (shingles) vaccine can prevent shingles. Shingles (also called herpes zoster, or just zoster) is a painful skin rash, usually with blisters. In addition to the rash, shingles can cause fever, headache, chills, or upset stomach. More rarely, shingles can lead to pneumonia, hearing problems, blindness, brain inflammation (encephalitis), or death. The most common complication of shingles is long-term nerve pain called postherpetic neuralgia (PHN). PHN occurs in the areas where the shingles rash was, even after the rash clears up. It can last for months or years after the rash goes away. The pain from PHN can be severe and debilitating. About 10 to 18% of people who get shingles will experience PHN. The risk of PHN increases with age. An older adult with shingles is more likely to develop PHN and have longer lasting and more severe pain than a younger person with shingles. Shingles is caused by the varicella zoster virus, the same virus that causes chickenpox. After you have chickenpox, the virus stays in your body and can cause shingles later in life. Shingles cannot be passed from one person to another, but the virus that causes shingles can spread and cause chickenpox in someone who had never had chickenpox or received chickenpox vaccine. 2. Recombinant shingles vaccine Recombinant shingles vaccine provides strong protection against shingles. By preventing shingles, recombinant shingles vaccine also protects against PHN. Recombinant shingles vaccine is the preferred vaccine for the prevention of shingles. However, a different vaccine, live shingles vaccine, may be used in some circumstances. The recombinant shingles vaccine is recommended for adults 50 years and older without serious immune problems. It is given as a two-dose series. This vaccine is also recommended for people who have already gotten  another type of shingles vaccine, the live shingles vaccine. There is no live virus in this vaccine. Shingles vaccine may be given at the same time as other vaccines. 3. Talk with your health care provider Tell your vaccine provider if the person getting the vaccine:  Has had an allergic reaction after a previous dose of recombinant shingles vaccine, or has any severe, life-threatening allergies.  Is pregnant or breastfeeding.  Is currently experiencing an episode of shingles. In some cases, your health care provider may decide to postpone shingles vaccination to a future visit. People with minor illnesses, such as a cold, may be vaccinated. People who are moderately or severely ill should usually wait until they recover before getting recombinant shingles vaccine. Your health care provider can give you more information. 4. Risks of a vaccine reaction  A sore arm with mild or moderate pain is very common after recombinant shingles vaccine, affecting about 80% of vaccinated people. Redness and swelling can also happen at the site of the injection.  Tiredness, muscle pain, headache, shivering, fever, stomach pain, and nausea happen after vaccination in more than half of people who receive recombinant shingles vaccine. In clinical trials, about 1 out of 6 people who got recombinant zoster vaccine experienced side effects that prevented them from doing regular activities. Symptoms usually went away on their own in 2 to 3 days. You should still get the second dose of recombinant zoster vaccine even if you had one of these reactions after the first dose. People sometimes faint after medical procedures, including vaccination. Tell your provider if you feel dizzy or have vision changes or ringing in the ears. As with any medicine, there is a very remote chance of a vaccine causing   a severe allergic reaction, other serious injury, or death. 5. What if there is a serious problem? An allergic reaction  could occur after the vaccinated person leaves the clinic. If you see signs of a severe allergic reaction (hives, swelling of the face and throat, difficulty breathing, a fast heartbeat, dizziness, or weakness), call 9-1-1 and get the person to the nearest hospital. For other signs that concern you, call your health care provider. Adverse reactions should be reported to the Vaccine Adverse Event Reporting System (VAERS). Your health care provider will usually file this report, or you can do it yourself. Visit the VAERS website at www.vaers.hhs.gov or call 1-800-822-7967. VAERS is only for reporting reactions, and VAERS staff do not give medical advice. 6. How can I learn more?  Ask your health care provider.  Call your local or state health department.  Contact the Centers for Disease Control and Prevention (CDC): ? Call 1-800-232-4636 (1-800-CDC-INFO) or ? Visit CDC's website at www.cdc.gov/vaccines Vaccine Information Statement Recombinant Zoster Vaccine (06/06/2018) This information is not intended to replace advice given to you by your health care provider. Make sure you discuss any questions you have with your health care provider. Document Revised: 11/13/2018 Document Reviewed: 02/28/2018 Elsevier Patient Education  2020 Elsevier Inc.  

## 2020-01-21 LAB — COMPREHENSIVE METABOLIC PANEL
ALT: 23 IU/L (ref 0–32)
AST: 39 IU/L (ref 0–40)
Albumin/Globulin Ratio: 1.8 (ref 1.2–2.2)
Albumin: 4.5 g/dL (ref 3.8–4.9)
Alkaline Phosphatase: 80 IU/L (ref 48–121)
BUN/Creatinine Ratio: 13 (ref 9–23)
BUN: 11 mg/dL (ref 6–24)
Bilirubin Total: 0.5 mg/dL (ref 0.0–1.2)
CO2: 25 mmol/L (ref 20–29)
Calcium: 10.5 mg/dL — ABNORMAL HIGH (ref 8.7–10.2)
Chloride: 100 mmol/L (ref 96–106)
Creatinine, Ser: 0.84 mg/dL (ref 0.57–1.00)
GFR calc Af Amer: 88 mL/min/{1.73_m2} (ref >59)
GFR calc non Af Amer: 76 mL/min/{1.73_m2} (ref >59)
Globulin, Total: 2.5 g/dL (ref 1.5–4.5)
Glucose: 85 mg/dL (ref 65–99)
Potassium: 4.4 mmol/L (ref 3.5–5.2)
Sodium: 141 mmol/L (ref 134–144)
Total Protein: 7 g/dL (ref 6.0–8.5)

## 2020-01-21 LAB — LIPID PANEL
Chol/HDL Ratio: 4 ratio (ref 0.0–4.4)
Cholesterol, Total: 184 mg/dL (ref 100–199)
HDL: 46 mg/dL (ref >39)
LDL Chol Calc (NIH): 97 mg/dL (ref 0–99)
Triglycerides: 243 mg/dL — ABNORMAL HIGH (ref 0–149)
VLDL Cholesterol Cal: 41 mg/dL — ABNORMAL HIGH (ref 5–40)

## 2020-01-21 LAB — CBC WITH DIFFERENTIAL/PLATELET
Basophils Absolute: 0.1 10*3/uL (ref 0.0–0.2)
Basos: 1 %
EOS (ABSOLUTE): 0.3 10*3/uL (ref 0.0–0.4)
Eos: 5 %
Hematocrit: 45.7 % (ref 34.0–46.6)
Hemoglobin: 15.2 g/dL (ref 11.1–15.9)
Immature Grans (Abs): 0 10*3/uL (ref 0.0–0.1)
Immature Granulocytes: 0 %
Lymphocytes Absolute: 2.5 10*3/uL (ref 0.7–3.1)
Lymphs: 34 %
MCH: 32.3 pg (ref 26.6–33.0)
MCHC: 33.3 g/dL (ref 31.5–35.7)
MCV: 97 fL (ref 79–97)
Monocytes Absolute: 0.6 10*3/uL (ref 0.1–0.9)
Monocytes: 8 %
Neutrophils Absolute: 3.7 10*3/uL (ref 1.4–7.0)
Neutrophils: 52 %
Platelets: 285 10*3/uL (ref 150–450)
RBC: 4.71 x10E6/uL (ref 3.77–5.28)
RDW: 12.7 % (ref 11.7–15.4)
WBC: 7.2 10*3/uL (ref 3.4–10.8)

## 2020-01-21 LAB — TSH: TSH: 2.02 u[IU]/mL (ref 0.450–4.500)

## 2020-01-31 ENCOUNTER — Encounter: Payer: Self-pay | Admitting: *Deleted

## 2020-01-31 ENCOUNTER — Telehealth: Payer: Self-pay | Admitting: *Deleted

## 2020-01-31 DIAGNOSIS — Z122 Encounter for screening for malignant neoplasm of respiratory organs: Secondary | ICD-10-CM

## 2020-01-31 DIAGNOSIS — Z87891 Personal history of nicotine dependence: Secondary | ICD-10-CM

## 2020-01-31 NOTE — Telephone Encounter (Signed)
Received referral for initial lung cancer screening scan. Contacted patient and obtained smoking history,(current, 33 pack year) as well as answering questions related to screening process. Patient denies signs of lung cancer such as weight loss or hemoptysis. Patient denies comorbidity that would prevent curative treatment if lung cancer were found. Patient is scheduled for shared decision making visit and CT scan on 02/13/20.

## 2020-01-31 NOTE — Telephone Encounter (Signed)
Received referral for low dose lung cancer screening CT scan. Message left at phone number listed in EMR for patient to call me back to facilitate scheduling scan.  

## 2020-02-13 ENCOUNTER — Other Ambulatory Visit: Payer: Self-pay

## 2020-02-13 ENCOUNTER — Encounter: Payer: Self-pay | Admitting: Hospice and Palliative Medicine

## 2020-02-13 ENCOUNTER — Ambulatory Visit: Admission: RE | Admit: 2020-02-13 | Payer: BC Managed Care – PPO | Source: Ambulatory Visit

## 2020-02-13 ENCOUNTER — Inpatient Hospital Stay: Payer: BC Managed Care – PPO | Attending: Oncology | Admitting: Hospice and Palliative Medicine

## 2020-02-13 DIAGNOSIS — F17210 Nicotine dependence, cigarettes, uncomplicated: Secondary | ICD-10-CM

## 2020-02-13 DIAGNOSIS — Z122 Encounter for screening for malignant neoplasm of respiratory organs: Secondary | ICD-10-CM

## 2020-02-13 DIAGNOSIS — Z87891 Personal history of nicotine dependence: Secondary | ICD-10-CM

## 2020-02-13 NOTE — Progress Notes (Signed)
Virtual Visit via Video Note  I connected with@ on 02/13/20 at@ by a video enabled telemedicine application and verified that I am speaking with the correct person using two identifiers.   I discussed the limitations of evaluation and management by telemedicine and the availability of in person appointments. The patient expressed understanding and agreed to proceed.  In accordance with CMS guidelines, patient has met eligibility criteria including age, absence of signs or symptoms of lung cancer.  Social History   Tobacco Use   Smoking status: Current Every Day Smoker    Packs/day: 1.00    Years: 25.00    Pack years: 25.00    Types: Cigarettes   Smokeless tobacco: Current User   Tobacco comment: smoked earlier in life then quit and restarted 2008  Substance Use Topics   Alcohol use: Yes    Alcohol/week: 21.0 standard drinks    Types: 21 Glasses of wine per week   Drug use: Never      A shared decision-making session was conducted prior to the performance of CT scan. This includes one or more decision aids, includes benefits and harms of screening, follow-up diagnostic testing, over-diagnosis, false positive rate, and total radiation exposure.   Counseling on the importance of adherence to annual lung cancer LDCT screening, impact of co-morbidities, and ability or willingness to undergo diagnosis and treatment is imperative for compliance of the program.   Counseling on the importance of continued smoking cessation for former smokers; the importance of smoking cessation for current smokers, and information about tobacco cessation interventions have been given to patient including [facility] and [facility] quit [place] programs.   Written order for lung cancer screening with LDCT has been given to the patient and any and all questions have been answered to the best of my abilities.    Yearly follow up will be coordinated by Burgess Estelle, Thoracic Navigator.  Time Total: 15  minutes  Visit consisted of counseling and education dealing with complex health screening. Greater than 50%  of this time was spent counseling and coordinating care related to the above assessment and plan.  Signed by: Altha Harm, PhD, NP-C

## 2020-03-11 ENCOUNTER — Ambulatory Visit
Admission: RE | Admit: 2020-03-11 | Discharge: 2020-03-11 | Disposition: A | Discharge status: Home / Self Care | Payer: BC Managed Care – PPO | Source: Ambulatory Visit | Attending: Oncology | Admitting: Oncology

## 2020-03-11 ENCOUNTER — Other Ambulatory Visit: Payer: Self-pay

## 2020-03-11 DIAGNOSIS — Z122 Encounter for screening for malignant neoplasm of respiratory organs: Secondary | ICD-10-CM | POA: Insufficient documentation

## 2020-03-11 DIAGNOSIS — Z87891 Personal history of nicotine dependence: Secondary | ICD-10-CM | POA: Diagnosis not present

## 2020-03-11 DIAGNOSIS — F17210 Nicotine dependence, cigarettes, uncomplicated: Secondary | ICD-10-CM | POA: Diagnosis not present

## 2020-03-12 ENCOUNTER — Encounter: Payer: Self-pay | Admitting: *Deleted

## 2020-03-12 NOTE — Telephone Encounter (Signed)
Notified patient of LDCT lung cancer screening program results with recommendation for 12 month follow up imaging. Also notified of incidental findings noted below and is encouraged to discuss further with PCP who will receive a copy of this note and/or the CT report. Patient verbalizes understanding.  ° °IMPRESSION: °1. Lung-RADS 2, benign appearance or behavior. Continue annual °screening with low-dose chest CT without contrast in 12 months. °2. Emphysema (ICD10-J43.9). °

## 2020-03-23 ENCOUNTER — Ambulatory Visit: Payer: BC Managed Care – PPO

## 2020-03-30 ENCOUNTER — Other Ambulatory Visit: Payer: Self-pay

## 2020-03-30 ENCOUNTER — Ambulatory Visit: Payer: BC Managed Care – PPO

## 2020-04-06 ENCOUNTER — Ambulatory Visit: Payer: BC Managed Care – PPO

## 2020-04-06 ENCOUNTER — Other Ambulatory Visit: Payer: Self-pay

## 2020-04-06 ENCOUNTER — Ambulatory Visit (INDEPENDENT_AMBULATORY_CARE_PROVIDER_SITE_OTHER): Payer: BC Managed Care – PPO

## 2020-04-06 DIAGNOSIS — Z23 Encounter for immunization: Secondary | ICD-10-CM

## 2020-05-04 ENCOUNTER — Telehealth: Payer: Self-pay | Admitting: Internal Medicine

## 2020-05-04 NOTE — Telephone Encounter (Signed)
Called pt told her that she could get the booster shot but wait a few weeks after the the booster to get the flu vaccine. Pt verbalized understanding.  KP

## 2020-05-04 NOTE — Telephone Encounter (Signed)
Copied from CRM [id number]. Topic: General - Inquiry >> May 04, 2020  1:51 PM Adrian Prince D wrote: Reason for CRM: Patient wants to know if its okay to get her booster and she also need a flu shot.She wants a call from Dr. Judithann Graves or her nurse. She can be reached at [phone]. Please advise

## 2020-09-07 DIAGNOSIS — L570 Actinic keratosis: Secondary | ICD-10-CM | POA: Diagnosis not present

## 2020-09-07 DIAGNOSIS — D2260 Melanocytic nevi of unspecified upper limb, including shoulder: Secondary | ICD-10-CM | POA: Diagnosis not present

## 2020-09-07 DIAGNOSIS — D2230 Melanocytic nevi of unspecified part of face: Secondary | ICD-10-CM | POA: Diagnosis not present

## 2020-09-07 DIAGNOSIS — C44329 Squamous cell carcinoma of skin of other parts of face: Secondary | ICD-10-CM | POA: Diagnosis not present

## 2020-09-07 DIAGNOSIS — D225 Melanocytic nevi of trunk: Secondary | ICD-10-CM | POA: Diagnosis not present

## 2020-09-07 DIAGNOSIS — D485 Neoplasm of uncertain behavior of skin: Secondary | ICD-10-CM | POA: Diagnosis not present

## 2020-09-07 DIAGNOSIS — D224 Melanocytic nevi of scalp and neck: Secondary | ICD-10-CM | POA: Diagnosis not present

## 2020-09-07 DIAGNOSIS — C44722 Squamous cell carcinoma of skin of right lower limb, including hip: Secondary | ICD-10-CM | POA: Diagnosis not present

## 2020-09-24 IMAGING — CT CT CHEST LUNG CANCER SCREENING LOW DOSE W/O CM
1 series · 15 of 31 positions shown, 19 images · non-contrast
Comparison: None.

CLINICAL DATA: 58-year-old asymptomatic female current smoker with
33 pack-year smoking history.

EXAM:
CT CHEST WITHOUT CONTRAST LOW-DOSE FOR LUNG CANCER SCREENING
TECHNIQUE: Multidetector CT imaging of the chest was performed following the
standard protocol without IV contrast.

[Series 2: axial st · axial · 0.71mm/px · z∈[-789,-509]mm · 15 of 62 slices shown, 19 images]
[im 3/62  mediastinal]
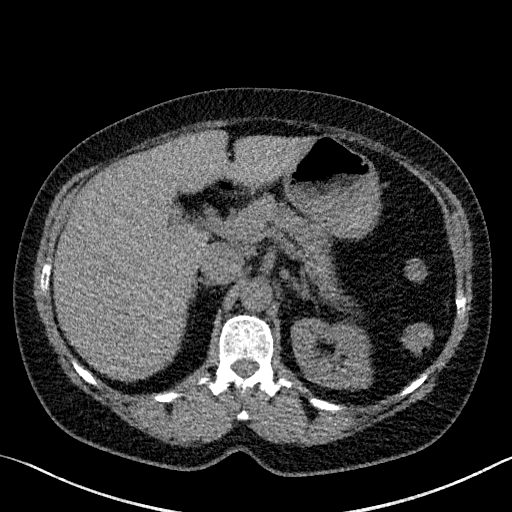
[im 3/62  lung]
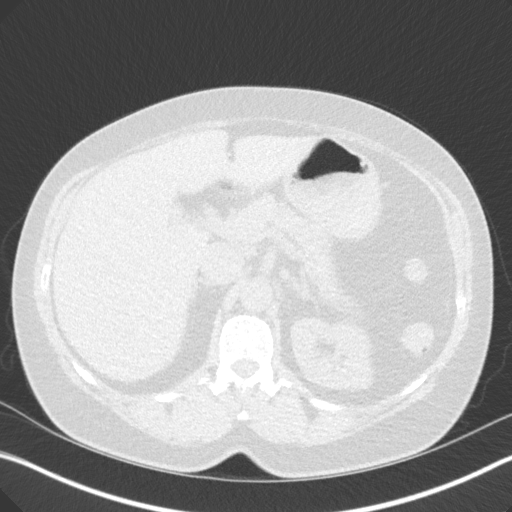
[im 7/62  lung]
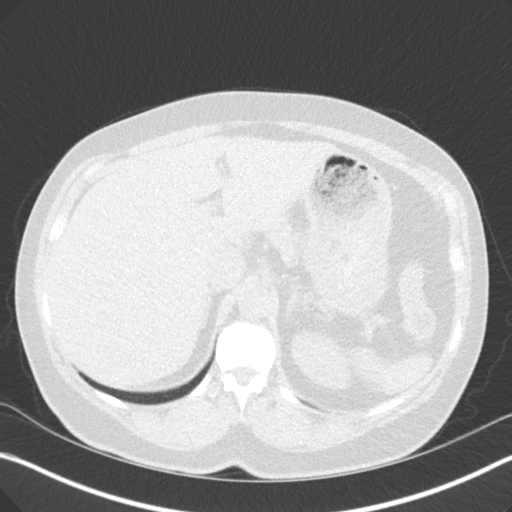
[im 12/62  lung]
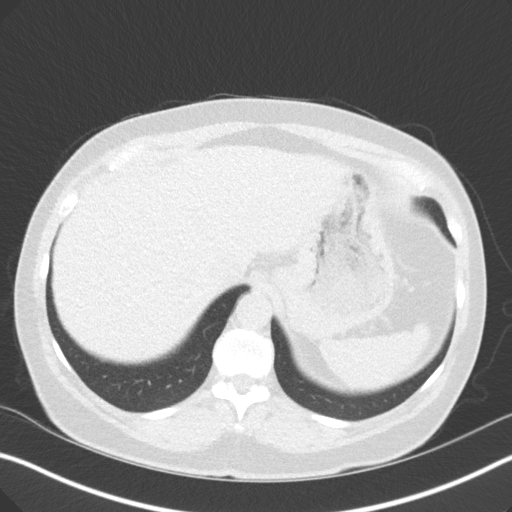
[im 14/62  lung]
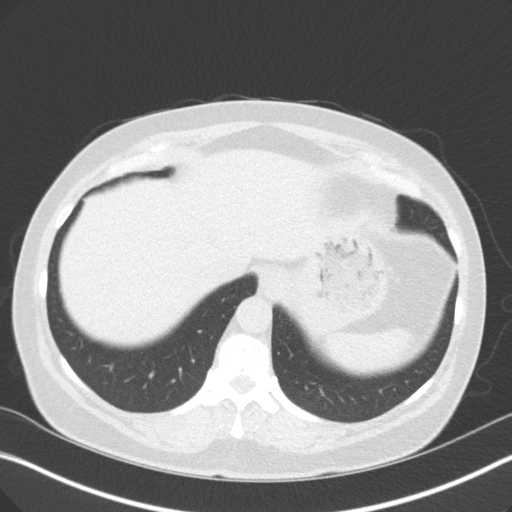
[im 19/62  mediastinal]
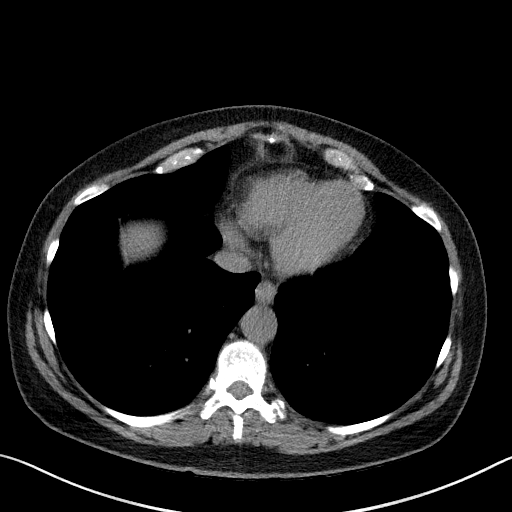
[im 19/62  lung]
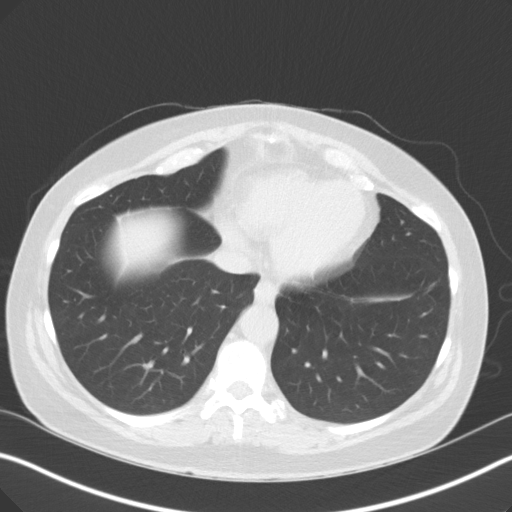
[im 23/62  lung]
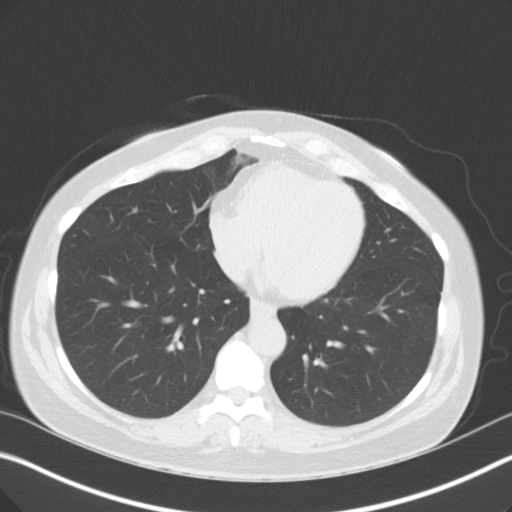
[im 28/62  lung]
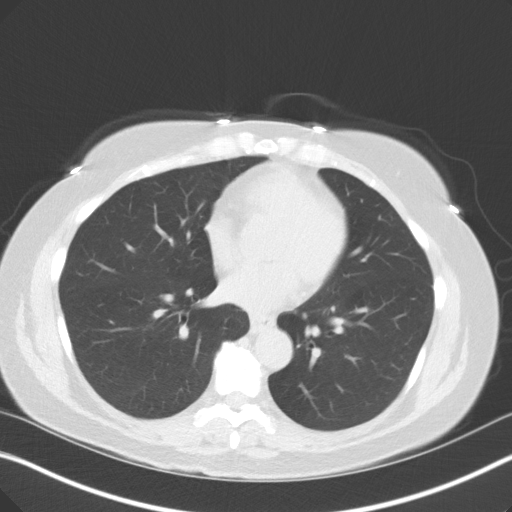
[im 32/62  lung]
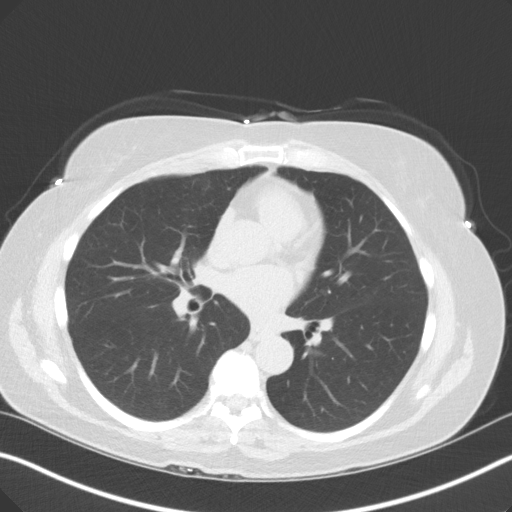
[im 34/62  mediastinal]
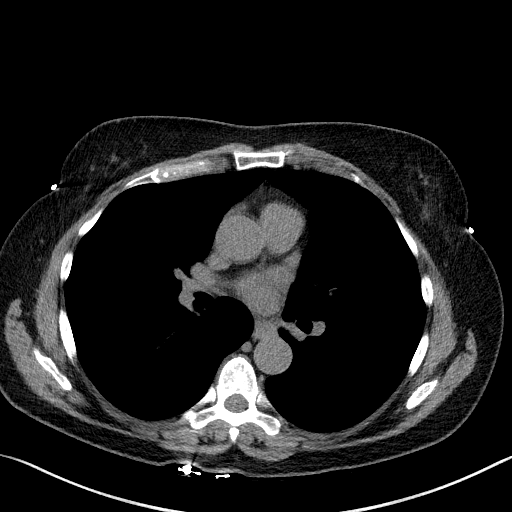
[im 34/62  lung]
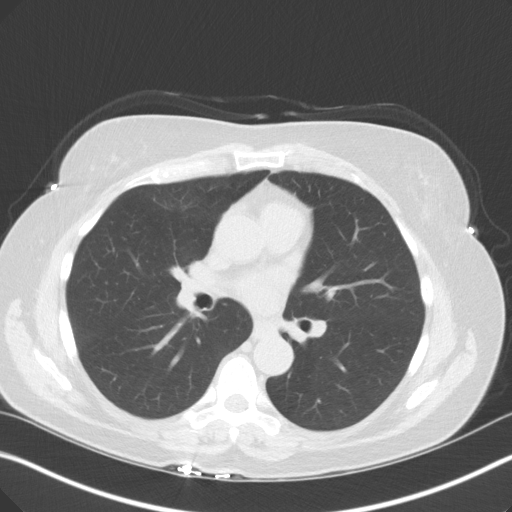
[im 39/62  lung]
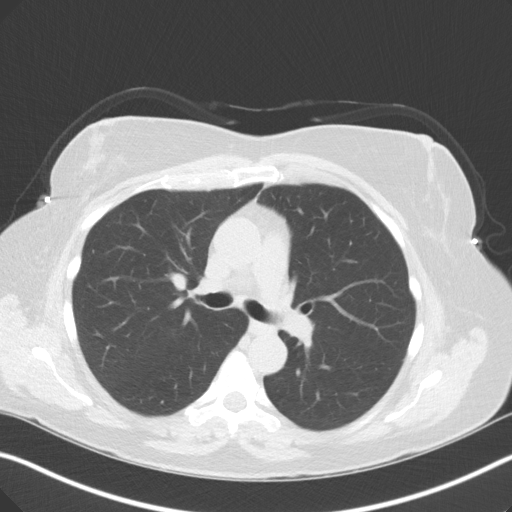
[im 43/62  lung]
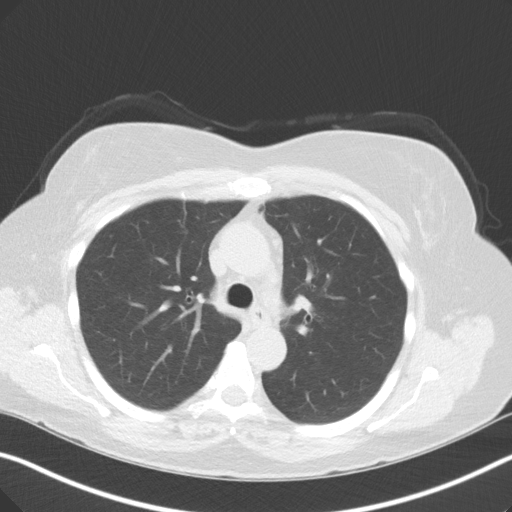
[im 48/62  lung]
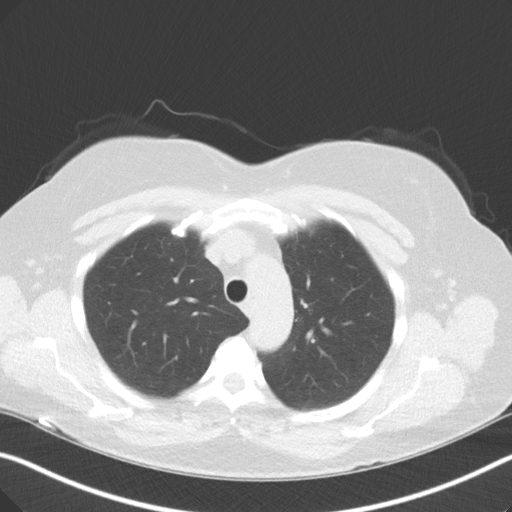
[im 50/62  mediastinal]
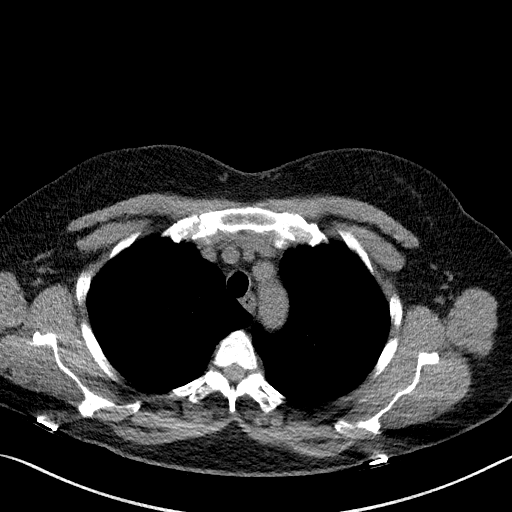
[im 50/62  lung]
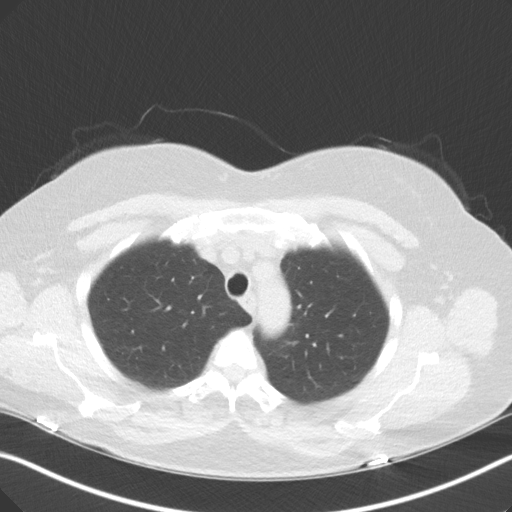
[im 55/62  lung]
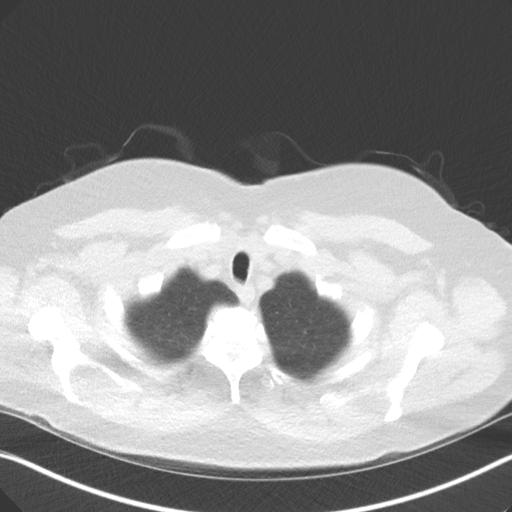
[im 59/62  lung]
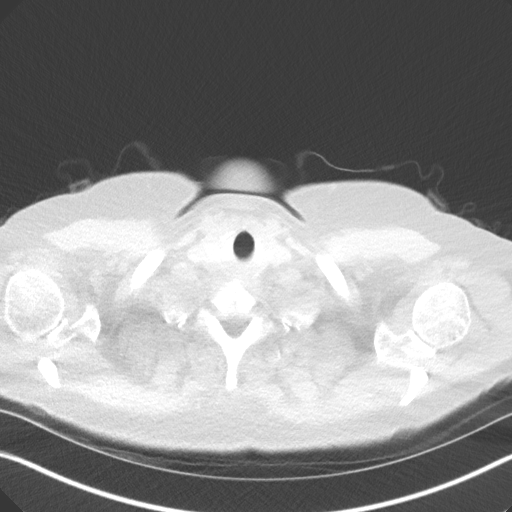

[15 of 31 positions shown; findings below may reference images not displayed]

FINDINGS: Cardiovascular: Normal heart size. No significant pericardial
effusion/thickening. Great vessels are normal in course and caliber.

Mediastinum/Nodes: No discrete thyroid nodules. Unremarkable
esophagus. No pathologically enlarged axillary, mediastinal or hilar
lymph nodes, noting limited sensitivity for the detection of hilar
adenopathy on this noncontrast study.

Lungs/Pleura: No pneumothorax. No pleural effusion. Mild
centrilobular emphysema. No acute consolidative airspace disease or
lung masses. Tiny solid pulmonary nodule measuring 2.3 mm in volume
derived mean diameter in the medial basilar right lower lobe (series
3/image 241). Tiny calcified left lower lobe granuloma. No
additional significant pulmonary nodules.

Upper abdomen: Simple 1 4 cm lateral segment liver cyst.

Musculoskeletal: No aggressive appearing focal osseous lesions.
Moderate thoracic spondylosis.
IMPRESSION: 1. Lung-RADS 2, benign appearance or behavior. Continue annual
screening with low-dose chest CT without contrast in 12 months.
2. Emphysema ([id number]).

## 2020-09-25 ENCOUNTER — Telehealth: Payer: Self-pay | Admitting: Internal Medicine

## 2020-09-25 NOTE — Telephone Encounter (Signed)
Patient informed. 

## 2020-09-25 NOTE — Telephone Encounter (Signed)
Pt would like to ask dr berglund it she can take nutrofol pill for hair growth. Please advise

## 2020-09-25 NOTE — Telephone Encounter (Signed)
I am not familiar with this product but I do not see any concerning ingredients.

## 2020-11-02 DIAGNOSIS — Z1231 Encounter for screening mammogram for malignant neoplasm of breast: Secondary | ICD-10-CM | POA: Diagnosis not present

## 2020-12-08 ENCOUNTER — Other Ambulatory Visit: Payer: Self-pay | Admitting: Internal Medicine

## 2020-12-08 DIAGNOSIS — I10 Essential (primary) hypertension: Secondary | ICD-10-CM

## 2020-12-08 MED ORDER — LISINOPRIL-HYDROCHLOROTHIAZIDE 20-25 MG PO TABS: 1.0000 | ORAL_TABLET | Freq: Every day | ORAL | 1 refills | Status: DC

## 2020-12-08 NOTE — Telephone Encounter (Signed)
Pharmacy comment: Alternative Requested:PT PAYING $25 FOR BENAZ. PT REQUESTING LOWER-COST ALTERNATIVE: LISIN. $5;

## 2020-12-08 NOTE — Telephone Encounter (Signed)
New Rx sent to [facility] 

## 2021-01-17 DIAGNOSIS — H43812 Vitreous degeneration, left eye: Secondary | ICD-10-CM | POA: Diagnosis not present

## 2021-01-21 ENCOUNTER — Encounter: Payer: BC Managed Care – PPO | Admitting: Internal Medicine

## 2021-02-22 DIAGNOSIS — H43812 Vitreous degeneration, left eye: Secondary | ICD-10-CM | POA: Diagnosis not present

## 2021-03-18 ENCOUNTER — Other Ambulatory Visit: Payer: Self-pay | Admitting: Internal Medicine

## 2021-03-18 DIAGNOSIS — E782 Mixed hyperlipidemia: Secondary | ICD-10-CM

## 2021-03-18 NOTE — Telephone Encounter (Signed)
Requested medications are due for refill today.  yes  Requested medications are on the active medications list.  yes  Last refill. 01/20/2020  Future visit scheduled.   yes  Notes to clinic.  Prescription is expired.

## 2021-04-01 ENCOUNTER — Other Ambulatory Visit: Payer: Self-pay | Admitting: Internal Medicine

## 2021-04-01 DIAGNOSIS — E782 Mixed hyperlipidemia: Secondary | ICD-10-CM

## 2021-04-01 NOTE — Telephone Encounter (Signed)
  Notes to clinic:  REQUEST FOR 90 DAYS PRESCRIPTION   Requested Prescriptions  Pending Prescriptions Disp Refills   atorvastatin (LIPITOR) 10 MG tablet [Pharmacy Med Name: ATORVASTATIN 10 MG TABLET] 90 tablet 1    Sig: TAKE 1 TABLET BY MOUTH EVERY DAY     Cardiovascular:  Antilipid - Statins Failed - 04/01/2021  7:29 AM      Failed - Total Cholesterol in normal range and within 360 days    Cholesterol, Total  Date Value Ref Range Status  01/20/2020 184 100 - 199 mg/dL Final          Failed - LDL in normal range and within 360 days    LDL Chol Calc (NIH)  Date Value Ref Range Status  01/20/2020 97 0 - 99 mg/dL Final          Failed - HDL in normal range and within 360 days    HDL  Date Value Ref Range Status  01/20/2020 46 >39 mg/dL Final          Failed - Triglycerides in normal range and within 360 days    Triglycerides  Date Value Ref Range Status  01/20/2020 243 (H) 0 - 149 mg/dL Final          Failed - Valid encounter within last 12 months    Recent Outpatient Visits           1 year ago Annual physical exam   [facility] Reubin Milan, MD   2 years ago Annual physical exam   [facility] Reubin Milan, MD   2 years ago Acute non-recurrent maxillary sinusitis   [facility] Reubin Milan, MD   3 years ago Acute non-recurrent maxillary sinusitis   [facility] Reubin Milan, MD   3 years ago Annual physical exam   [facility] Reubin Milan, MD       Future Appointments             In 1 month Judithann Graves Nyoka Cowden, MD [facility]            Passed - Patient is not pregnant

## 2021-04-06 DIAGNOSIS — C44622 Squamous cell carcinoma of skin of right upper limb, including shoulder: Secondary | ICD-10-CM | POA: Diagnosis not present

## 2021-04-06 DIAGNOSIS — L570 Actinic keratosis: Secondary | ICD-10-CM | POA: Diagnosis not present

## 2021-04-06 DIAGNOSIS — D2230 Melanocytic nevi of unspecified part of face: Secondary | ICD-10-CM | POA: Diagnosis not present

## 2021-04-06 DIAGNOSIS — D224 Melanocytic nevi of scalp and neck: Secondary | ICD-10-CM | POA: Diagnosis not present

## 2021-04-06 DIAGNOSIS — D225 Melanocytic nevi of trunk: Secondary | ICD-10-CM | POA: Diagnosis not present

## 2021-04-12 ENCOUNTER — Other Ambulatory Visit: Payer: Self-pay | Admitting: Internal Medicine

## 2021-04-12 DIAGNOSIS — E782 Mixed hyperlipidemia: Secondary | ICD-10-CM

## 2021-05-07 ENCOUNTER — Other Ambulatory Visit: Payer: Self-pay | Admitting: Internal Medicine

## 2021-05-07 DIAGNOSIS — E782 Mixed hyperlipidemia: Secondary | ICD-10-CM

## 2021-05-14 ENCOUNTER — Encounter: Payer: Self-pay | Admitting: Internal Medicine

## 2021-05-14 ENCOUNTER — Ambulatory Visit (INDEPENDENT_AMBULATORY_CARE_PROVIDER_SITE_OTHER): Payer: BC Managed Care – PPO | Admitting: Internal Medicine

## 2021-05-14 ENCOUNTER — Other Ambulatory Visit: Payer: Self-pay

## 2021-05-14 ENCOUNTER — Other Ambulatory Visit (HOSPITAL_COMMUNITY)
Admission: RE | Admit: 2021-05-14 | Discharge: 2021-05-14 | Disposition: A | Discharge status: Home / Self Care | Payer: BC Managed Care – PPO | Source: Ambulatory Visit | Attending: Internal Medicine | Admitting: Internal Medicine

## 2021-05-14 VITALS — BP 126/82 | HR 75 | Temp 98.2°F | Ht 65.0 in | Wt 182.0 lb

## 2021-05-14 DIAGNOSIS — Z0000 Encounter for general adult medical examination without abnormal findings: Secondary | ICD-10-CM

## 2021-05-14 DIAGNOSIS — Z124 Encounter for screening for malignant neoplasm of cervix: Secondary | ICD-10-CM | POA: Diagnosis not present

## 2021-05-14 DIAGNOSIS — Z1211 Encounter for screening for malignant neoplasm of colon: Secondary | ICD-10-CM | POA: Diagnosis not present

## 2021-05-14 DIAGNOSIS — Z1231 Encounter for screening mammogram for malignant neoplasm of breast: Secondary | ICD-10-CM

## 2021-05-14 DIAGNOSIS — E782 Mixed hyperlipidemia: Secondary | ICD-10-CM

## 2021-05-14 DIAGNOSIS — I10 Essential (primary) hypertension: Secondary | ICD-10-CM

## 2021-05-14 LAB — POCT URINALYSIS DIPSTICK
Bilirubin, UA: NEGATIVE
Glucose, UA: NEGATIVE
Ketones, UA: NEGATIVE
Leukocytes, UA: NEGATIVE
Nitrite, UA: NEGATIVE
Protein, UA: NEGATIVE
Spec Grav, UA: 1.015 (ref 1.010–1.025)
Urobilinogen, UA: 0.2 E.U./dL
pH, UA: 5 (ref 5.0–8.0)

## 2021-05-14 MED ORDER — LISINOPRIL-HYDROCHLOROTHIAZIDE 20-25 MG PO TABS: 1.0000 | ORAL_TABLET | Freq: Every day | ORAL | 1 refills | Status: DC

## 2021-05-14 MED ORDER — ATORVASTATIN CALCIUM 10 MG PO TABS: 10.0000 mg | ORAL_TABLET | Freq: Every day | ORAL | 1 refills | Status: DC

## 2021-05-14 NOTE — Progress Notes (Signed)
Date:  05/14/2021   Name:  Renee Graham   DOB:  [birth year]   MRN:  [id number]   Chief Complaint: Annual Exam (Breast exam with pap ) Renee Graham is a 60 y.o. female who presents today for her Complete Annual Exam. She feels well. She reports exercising at work. She reports she is sleeping well. Breast complaints none.  Mammogram: 10/2020 @ [facility] DEXA: none Pap smear: 08/2015 neg with HPV Colonoscopy: none - FIT x 2 not submitted  Immunization History  Administered Date(s) Administered   PFIZER Comirnaty(Gray Top)Covid-19 Tri-Sucrose Vaccine 03/04/2021   PFIZER(Purple Top)SARS-COV-2 Vaccination 10/16/2019, 11/04/2019   Zoster Recombinat (Shingrix) 01/20/2020, 04/06/2020    Hypertension This is a chronic problem. The problem is controlled. Pertinent negatives include no chest pain, headaches, palpitations or shortness of breath. Past treatments include ACE inhibitors and diuretics. The current treatment provides significant improvement.  Hyperlipidemia This is a chronic problem. The problem is controlled. Pertinent negatives include no chest pain or shortness of breath. Current antihyperlipidemic treatment includes statins. The current treatment provides significant improvement of lipids. There are no compliance problems.    Lab Results  Component Value Date   CREATININE 0.84 01/20/2020   BUN 11 01/20/2020   NA 141 01/20/2020   K 4.4 01/20/2020   CL 100 01/20/2020   CO2 25 01/20/2020   Lab Results  Component Value Date   CHOL 184 01/20/2020   HDL 46 01/20/2020   LDLCALC 97 01/20/2020   TRIG 243 (H) 01/20/2020   CHOLHDL 4.0 01/20/2020   Lab Results  Component Value Date   TSH 2.020 01/20/2020   No results found for: HGBA1C Lab Results  Component Value Date   WBC 7.2 01/20/2020   HGB 15.2 01/20/2020   HCT 45.7 01/20/2020   MCV 97 01/20/2020   PLT 285 01/20/2020   Lab Results  Component Value Date   ALT 23 01/20/2020   AST 39 01/20/2020   ALKPHOS 80 01/20/2020    BILITOT 0.5 01/20/2020     Review of Systems  Constitutional:  Negative for chills, fatigue and fever.  HENT:  Negative for congestion, hearing loss, tinnitus, trouble swallowing and voice change.   Eyes:  Negative for visual disturbance.  Respiratory:  Negative for cough, chest tightness, shortness of breath and wheezing.   Cardiovascular:  Negative for chest pain, palpitations and leg swelling.  Gastrointestinal:  Negative for abdominal pain, constipation, diarrhea and vomiting.  Endocrine: Negative for polydipsia and polyuria.  Genitourinary:  Negative for dysuria, frequency, genital sores, vaginal bleeding and vaginal discharge.  Musculoskeletal:  Negative for arthralgias, gait problem and joint swelling.  Skin:  Negative for color change and rash.  Neurological:  Negative for dizziness, tremors, light-headedness and headaches.  Hematological:  Negative for adenopathy. Does not bruise/bleed easily.  Psychiatric/Behavioral:  Positive for sleep disturbance (hot flashes). Negative for dysphoric mood. The patient is not nervous/anxious.    Patient Active Problem List   Diagnosis Date Noted   Hematuria, microscopic 02/29/2016   Benign essential HTN 05/14/2015   Cigarette smoker 05/14/2015   H/O abnormal cervical Papanicolaou smear 05/14/2015   Migraine without aura and responsive to treatment 05/14/2015   Hyperlipidemia 05/14/2015    No Known Allergies  Past Surgical History:  Procedure Laterality Date   TUBAL LIGATION      Social History   Tobacco Use   Smoking status: Every Day    Packs/day: 1.00    Years: 25.00    Pack years: 25.00  Types: Cigarettes   Smokeless tobacco: Current   Tobacco comments:    smoked earlier in life then quit and restarted 2008  Substance Use Topics   Alcohol use: Yes    Alcohol/week: 21.0 standard drinks    Types: 21 Glasses of wine per week   Drug use: Never     Medication list has been reviewed and updated.  Current Meds   Medication Sig   ibuprofen (ADVIL,MOTRIN) 200 MG tablet Take 800 mg by mouth every 6 (six) hours as needed.   Multiple Vitamins-Minerals (HAIR SKIN AND NAILS FORMULA) TABS Take by mouth.   [DISCONTINUED] atorvastatin (LIPITOR) 10 MG tablet TAKE 1 TABLET BY MOUTH EVERY DAY   [DISCONTINUED] lisinopril-hydrochlorothiazide (ZESTORETIC) 20-25 MG tablet Take 1 tablet by mouth daily.    PHQ 2/9 Scores 05/14/2021 01/20/2020 01/18/2019 09/04/2017  PHQ - 2 Score 0 0 0 0  PHQ- 9 Score 2 1 - -    GAD 7 : Generalized Anxiety Score 05/14/2021 01/20/2020  Nervous, Anxious, on Edge 1 0  Control/stop worrying 1 1  Worry too much - different things 1 1  Trouble relaxing 0 0  Restless 0 0  Easily annoyed or irritable 0 0  Afraid - awful might happen 0 0  Total GAD 7 Score 3 2  Anxiety Difficulty Not difficult at all Not difficult at all    BP Readings from Last 3 Encounters:  05/14/21 126/82  01/20/20 122/74  01/18/19 124/78    Physical Exam Vitals and nursing note reviewed.  Constitutional:      General: She is not in acute distress.    Appearance: She is well-developed.  HENT:     Head: Normocephalic and atraumatic.     Right Ear: Tympanic membrane and ear canal normal.     Left Ear: Tympanic membrane and ear canal normal.     Nose:     Right Sinus: No maxillary sinus tenderness.     Left Sinus: No maxillary sinus tenderness.  Eyes:     General: No scleral icterus.       Right eye: No discharge.        Left eye: No discharge.     Conjunctiva/sclera: Conjunctivae normal.  Neck:     Thyroid: No thyromegaly.     Vascular: No carotid bruit.  Cardiovascular:     Rate and Rhythm: Normal rate and regular rhythm.     Pulses: Normal pulses.     Heart sounds: Normal heart sounds.  Pulmonary:     Effort: Pulmonary effort is normal. No respiratory distress.     Breath sounds: No wheezing.  Chest:  Breasts:    Right: No mass, nipple discharge, skin change or tenderness.     Left: No mass,  nipple discharge, skin change or tenderness.  Abdominal:     General: Bowel sounds are normal.     Palpations: Abdomen is soft.     Tenderness: There is no abdominal tenderness.  Genitourinary:    Labia:        Right: No tenderness, lesion or injury.        Left: No tenderness, lesion or injury.      Vagina: Prolapsed vaginal walls (mild anterior prolapse) present.     Cervix: Normal.     Uterus: Normal.      Adnexa: Right adnexa normal and left adnexa normal.  Musculoskeletal:     Cervical back: Normal range of motion. No erythema.     Right lower leg: No edema.  Left lower leg: No edema.  Lymphadenopathy:     Cervical: No cervical adenopathy.  Skin:    General: Skin is warm and dry.     Findings: No rash.  Neurological:     Mental Status: She is alert and oriented to person, place, and time.     Cranial Nerves: Cranial nerves are intact. No cranial nerve deficit.     Sensory: Sensation is intact. No sensory deficit.     Motor: Motor function is intact.     Deep Tendon Reflexes: Reflexes are normal and symmetric.  Psychiatric:        Attention and Perception: Attention normal.        Mood and Affect: Mood normal.        Speech: Speech normal.        Behavior: Behavior normal.    Wt Readings from Last 3 Encounters:  05/14/21 182 lb (82.6 kg)  01/20/20 180 lb (81.6 kg)  01/18/19 177 lb (80.3 kg)    BP 126/82   Pulse 75   Temp 98.2 F (36.8 C) (Oral)   Ht 5\' 5"  (1.651 m)   Wt 182 lb (82.6 kg)   LMP 12/07/2015   SpO2 97%   BMI 30.29 kg/m   Assessment and Plan: 1. Annual physical exam Exam is normal except for weight. Encourage regular exercise and appropriate dietary changes. Up to date on immunizations.  2. Encounter for screening mammogram for breast cancer Schedule at [facility] - MM 3D SCREEN BREAST BILATERAL  3. Colon cancer screening Declines colonoscopy - agrees to FIT - Fecal occult blood, imunochemical  4. Encounter for screening for cervical  cancer Pap obtained; exam normal. Repeat in 5 yrs if normal Pap with neg HPV - Cytology - PAP  5. Benign essential HTN Clinically stable exam with well controlled BP. Tolerating medications without side effects at this time. Pt to continue current regimen and low sodium diet; benefits of regular exercise as able discussed. - CBC with Differential/Platelet - Comprehensive metabolic panel - TSH - POCT urinalysis dipstick - + RBC chronic; s/p urology workup - lisinopril-hydrochlorothiazide (ZESTORETIC) 20-25 MG tablet; Take 1 tablet by mouth daily.  Dispense: 90 tablet; Refill: 1  6. Mixed hyperlipidemia Tolerating statin medication without side effects at this time Continue same therapy without change at this time. - Lipid panel - atorvastatin (LIPITOR) 10 MG tablet; Take 1 tablet (10 mg total) by mouth daily.  Dispense: 90 tablet; Refill: 1   Partially dictated using 02/06/2016. Any errors are unintentional.  Animal nutritionist, MD [facility]  05/14/2021

## 2021-05-15 LAB — CBC WITH DIFFERENTIAL/PLATELET
Basophils Absolute: 0.1 10*3/uL (ref 0.0–0.2)
Basos: 1 %
EOS (ABSOLUTE): 0.2 10*3/uL (ref 0.0–0.4)
Eos: 3 %
Hematocrit: 43.3 % (ref 34.0–46.6)
Hemoglobin: 14.3 g/dL (ref 11.1–15.9)
Immature Grans (Abs): 0 10*3/uL (ref 0.0–0.1)
Immature Granulocytes: 0 %
Lymphocytes Absolute: 2.8 10*3/uL (ref 0.7–3.1)
Lymphs: 33 %
MCH: 31.8 pg (ref 26.6–33.0)
MCHC: 33 g/dL (ref 31.5–35.7)
MCV: 96 fL (ref 79–97)
Monocytes Absolute: 0.7 10*3/uL (ref 0.1–0.9)
Monocytes: 9 %
Neutrophils Absolute: 4.5 10*3/uL (ref 1.4–7.0)
Neutrophils: 54 %
Platelets: 298 10*3/uL (ref 150–450)
RBC: 4.5 x10E6/uL (ref 3.77–5.28)
RDW: 12 % (ref 11.7–15.4)
WBC: 8.3 10*3/uL (ref 3.4–10.8)

## 2021-05-15 LAB — COMPREHENSIVE METABOLIC PANEL
ALT: 21 IU/L (ref 0–32)
AST: 33 IU/L (ref 0–40)
Albumin/Globulin Ratio: 1.9 (ref 1.2–2.2)
Albumin: 4.5 g/dL (ref 3.8–4.9)
Alkaline Phosphatase: 87 IU/L (ref 44–121)
BUN/Creatinine Ratio: 13 (ref 12–28)
BUN: 11 mg/dL (ref 8–27)
Bilirubin Total: 0.5 mg/dL (ref 0.0–1.2)
CO2: 23 mmol/L (ref 20–29)
Calcium: 10.2 mg/dL (ref 8.7–10.3)
Chloride: 100 mmol/L (ref 96–106)
Creatinine, Ser: 0.88 mg/dL (ref 0.57–1.00)
Globulin, Total: 2.4 g/dL (ref 1.5–4.5)
Glucose: 88 mg/dL (ref 70–99)
Potassium: 4 mmol/L (ref 3.5–5.2)
Sodium: 139 mmol/L (ref 134–144)
Total Protein: 6.9 g/dL (ref 6.0–8.5)
eGFR: 75 mL/min/{1.73_m2} (ref >59)

## 2021-05-15 LAB — TSH: TSH: 1.77 u[IU]/mL (ref 0.450–4.500)

## 2021-05-15 LAB — LIPID PANEL
Chol/HDL Ratio: 4.4 ratio (ref 0.0–4.4)
Cholesterol, Total: 206 mg/dL — ABNORMAL HIGH (ref 100–199)
HDL: 47 mg/dL (ref >39)
LDL Chol Calc (NIH): 117 mg/dL — ABNORMAL HIGH (ref 0–99)
Triglycerides: 243 mg/dL — ABNORMAL HIGH (ref 0–149)
VLDL Cholesterol Cal: 42 mg/dL — ABNORMAL HIGH (ref 5–40)

## 2021-05-17 LAB — CYTOLOGY - PAP
Comment: NEGATIVE
Diagnosis: NEGATIVE
High risk HPV: NEGATIVE

## 2021-06-18 DIAGNOSIS — J069 Acute upper respiratory infection, unspecified: Secondary | ICD-10-CM | POA: Diagnosis not present

## 2021-06-18 DIAGNOSIS — Z20822 Contact with and (suspected) exposure to covid-19: Secondary | ICD-10-CM | POA: Diagnosis not present

## 2021-06-21 ENCOUNTER — Ambulatory Visit: Payer: BC Managed Care – PPO | Admitting: Internal Medicine

## 2021-06-21 ENCOUNTER — Other Ambulatory Visit: Payer: Self-pay

## 2021-06-21 ENCOUNTER — Encounter: Payer: Self-pay | Admitting: Internal Medicine

## 2021-06-21 ENCOUNTER — Other Ambulatory Visit: Payer: Self-pay | Admitting: Internal Medicine

## 2021-06-21 VITALS — BP 132/78 | HR 77 | Ht 65.0 in | Wt 182.0 lb

## 2021-06-21 DIAGNOSIS — J069 Acute upper respiratory infection, unspecified: Secondary | ICD-10-CM | POA: Diagnosis not present

## 2021-06-21 DIAGNOSIS — J019 Acute sinusitis, unspecified: Secondary | ICD-10-CM

## 2021-06-21 MED ORDER — AZITHROMYCIN 250 MG PO TABS: ORAL_TABLET | ORAL | 0 refills | Status: AC

## 2021-06-21 NOTE — Patient Instructions (Addendum)
Stop flonase spray  Try Zyrtec 10 mg once a day  Continue Coricidin HBP

## 2021-06-21 NOTE — Progress Notes (Signed)
Date:  06/21/2021   Name:  Renee Graham   DOB:  07-02-1961   MRN:  [id number]   Chief Complaint: No chief complaint on file.  Cough This is a new problem. The current episode started in the past 7 days. The problem occurs every few minutes. The cough is Non-productive. Associated symptoms include a fever, headaches, nasal congestion, postnasal drip, rhinorrhea and a sore throat. Pertinent negatives include no chest pain, chills or wheezing. Associated symptoms comments: Sinus pressure.   Lab Results  Component Value Date   CREATININE 0.88 05/14/2021   BUN 11 05/14/2021   NA 139 05/14/2021   K 4.0 05/14/2021   CL 100 05/14/2021   CO2 23 05/14/2021   Lab Results  Component Value Date   CHOL 206 (H) 05/14/2021   HDL 47 05/14/2021   LDLCALC 117 (H) 05/14/2021   TRIG 243 (H) 05/14/2021   CHOLHDL 4.4 05/14/2021   Lab Results  Component Value Date   TSH 1.770 05/14/2021   No results found for: HGBA1C Lab Results  Component Value Date   WBC 8.3 05/14/2021   HGB 14.3 05/14/2021   HCT 43.3 05/14/2021   MCV 96 05/14/2021   PLT 298 05/14/2021   Lab Results  Component Value Date   ALT 21 05/14/2021   AST 33 05/14/2021   ALKPHOS 87 05/14/2021   BILITOT 0.5 05/14/2021     Review of Systems  Constitutional:  Positive for fatigue and fever. Negative for chills.  HENT:  Positive for postnasal drip, rhinorrhea and sore throat.   Respiratory:  Positive for cough. Negative for chest tightness and wheezing.   Cardiovascular:  Negative for chest pain, palpitations and leg swelling.  Gastrointestinal:  Negative for diarrhea, nausea and vomiting.  Neurological:  Positive for headaches.  Psychiatric/Behavioral:  Positive for sleep disturbance.    Patient Active Problem List   Diagnosis Date Noted   Hematuria, microscopic 02/29/2016   Benign essential HTN 05/14/2015   Cigarette smoker 05/14/2015   H/O abnormal cervical Papanicolaou smear 05/14/2015   Migraine without aura  and responsive to treatment 05/14/2015   Hyperlipidemia 05/14/2015    No Known Allergies  Past Surgical History:  Procedure Laterality Date   TUBAL LIGATION      Social History   Tobacco Use   Smoking status: Every Day    Packs/day: 1.00    Years: 25.00    Pack years: 25.00    Types: Cigarettes   Smokeless tobacco: Current   Tobacco comments:    smoked earlier in life then quit and restarted 2008  Substance Use Topics   Alcohol use: Yes    Alcohol/week: 21.0 standard drinks    Types: 21 Glasses of wine per week   Drug use: Never     Medication list has been reviewed and updated.  Current Meds  Medication Sig   atorvastatin (LIPITOR) 10 MG tablet Take 1 tablet (10 mg total) by mouth daily.   chlorpheniramine-HYDROcodone (TUSSIONEX) 10-8 MG/5ML SUER Take 5 mLs by mouth at bedtime as needed.   fluticasone (FLONASE) 50 MCG/ACT nasal spray Place 2 sprays into both nostrils daily.   ibuprofen (ADVIL,MOTRIN) 200 MG tablet Take 800 mg by mouth every 6 (six) hours as needed.   lisinopril-hydrochlorothiazide (ZESTORETIC) 20-25 MG tablet Take 1 tablet by mouth daily.    PHQ 2/9 Scores 05/14/2021 01/20/2020 01/18/2019 09/04/2017  PHQ - 2 Score 0 0 0 0  PHQ- 9 Score 2 1 - -    GAD  7 : Generalized Anxiety Score 05/14/2021 01/20/2020  Nervous, Anxious, on Edge 1 0  Control/stop worrying 1 1  Worry too much - different things 1 1  Trouble relaxing 0 0  Restless 0 0  Easily annoyed or irritable 0 0  Afraid - awful might happen 0 0  Total GAD 7 Score 3 2  Anxiety Difficulty Not difficult at all Not difficult at all    BP Readings from Last 3 Encounters:  06/21/21 132/78  05/14/21 126/82  01/20/20 122/74    Physical Exam Constitutional:      Appearance: She is ill-appearing.  HENT:     Head: Normocephalic.     Right Ear: Tympanic membrane and ear canal normal.     Left Ear: Tympanic membrane and ear canal normal.     Nose:     Right Sinus: Maxillary sinus tenderness  present. No frontal sinus tenderness.     Left Sinus: Maxillary sinus tenderness present. No frontal sinus tenderness.     Mouth/Throat:     Pharynx: Posterior oropharyngeal erythema present. No oropharyngeal exudate.  Cardiovascular:     Rate and Rhythm: Normal rate and regular rhythm.     Pulses: Normal pulses.  Pulmonary:     Effort: Pulmonary effort is normal.     Breath sounds: No wheezing or rhonchi.  Musculoskeletal:     Cervical back: Normal range of motion.  Lymphadenopathy:     Cervical: No cervical adenopathy.  Skin:    General: Skin is warm and dry.  Neurological:     Mental Status: She is alert.    Wt Readings from Last 3 Encounters:  06/21/21 182 lb (82.6 kg)  05/14/21 182 lb (82.6 kg)  01/20/20 180 lb (81.6 kg)    BP 132/78   Pulse 77   Ht 5\' 5"  (1.651 m)   Wt 182 lb (82.6 kg)   LMP 12/07/2015   SpO2 99%   BMI 30.29 kg/m   Assessment and Plan: 1. 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 Acute non-recurrent sinusitis, unspecified location May have developed a bacterial infection following the acute illness Will cover for URI pathogen with Zpak - azithromycin (ZITHROMAX Z-PAK) 250 MG tablet; UAD  Dispense: 6 each; Refill: 0   Partially dictated using June. Any errors are unintentional.  Animal nutritionist, MD [facility]  06/21/2021

## 2021-06-22 ENCOUNTER — Other Ambulatory Visit: Payer: Self-pay | Admitting: Internal Medicine

## 2021-06-22 DIAGNOSIS — J069 Acute upper respiratory infection, unspecified: Secondary | ICD-10-CM

## 2021-06-22 LAB — COVID-19, FLU A+B AND RSV
Influenza A, NAA: NOT DETECTED
Influenza B, NAA: NOT DETECTED
RSV, NAA: NOT DETECTED
SARS-CoV-2, NAA: NOT DETECTED

## 2021-06-22 MED ORDER — HYDROCOD POLST-CPM POLST ER 10-8 MG/5ML PO SUER: 5.0000 mL | Freq: Every evening | ORAL | 0 refills | Status: DC | PRN

## 2021-08-25 ENCOUNTER — Telehealth: Payer: Self-pay | Admitting: Acute Care

## 2021-08-25 NOTE — Telephone Encounter (Signed)
Left detailed message for pt to call back to schedule f/u lung screening CT.  °

## 2021-09-23 ENCOUNTER — Ambulatory Visit: Payer: Self-pay

## 2021-09-23 DIAGNOSIS — L209 Atopic dermatitis, unspecified: Secondary | ICD-10-CM | POA: Diagnosis not present

## 2021-09-23 DIAGNOSIS — L239 Allergic contact dermatitis, unspecified cause: Secondary | ICD-10-CM | POA: Diagnosis not present

## 2021-09-23 NOTE — Telephone Encounter (Signed)
Patient called, left VM to return the call to the office to discuss symptoms with a nurse.  Summary: rash all over her body for about a month now/discuss medication.   Pt stated she has a rash all over her body for about a month now saw a dermatologist today and dermatologist advised her to speak with PCP asap as it may be the BP medication causing this.   Pt requesting call back to discuss medication.   Seeking clinical advice.

## 2021-09-23 NOTE — Telephone Encounter (Signed)
Pt had called into NT but disconnected before speaking to nurse. Called pt back and left VM to call office back to speak with a nurse

## 2021-09-23 NOTE — Telephone Encounter (Signed)
Pt returned call. States saw dermatologist today for rash "All over my body." States he thinks it may be related to the HCTZ part of Lisinopril.Advised was to ask if she could try Lisinopril alone or with another diuretic.Pt would like to discuss with Dr. Army Melia. Does not know what BP has been running. "It's OK." States change to Lisinopril-HCTZ ws due to insurance issues. Please advise. Reason for Disposition  Mild widespread rash  Answer Assessment - Initial Assessment Questions 1. APPEARANCE of RASH: "Describe the rash." (e.g., spots, blisters, raised areas, skin peeling, scaly)     Please see summary, saw Dermatologist today. 2. SIZE: "How big are the spots?" (e.g., tip of pen, eraser, coin; inches, centimeters)     *No Answer* 3. LOCATION: "Where is the rash located?"     *No Answer* 4. COLOR: "What color is the rash?" (Note: It is difficult to assess rash color in people with darker-colored skin. When this situation occurs, simply ask the caller to describe what they see.)     *No Answer* 5. ONSET: "When did the rash begin?"     *No Answer* 6. FEVER: "Do you have a fever?" If Yes, ask: "What is your temperature, how was it measured, and when did it start?"     *No Answer* 7. ITCHING: "Does the rash itch?" If Yes, ask: "How bad is the itch?" (Scale 1-10; or mild, moderate, severe)     *No Answer* 8. CAUSE: "What do you think is causing the rash?"     *No Answer* 9. MEDICINE FACTORS: "Have you started any new medicines within the last 2 weeks?" (e.g., antibiotics)      *No Answer* 10. OTHER SYMPTOMS: "Do you have any other symptoms?" (e.g., dizziness, headache, sore throat, joint pain)       *No Answer* 11. PREGNANCY: "Is there any chance you are pregnant?" "When was your last menstrual period?"       *No Answer*  Protocols used: Rash or Redness - [facility]

## 2021-09-24 ENCOUNTER — Other Ambulatory Visit: Payer: Self-pay | Admitting: Internal Medicine

## 2021-09-24 ENCOUNTER — Other Ambulatory Visit: Payer: Self-pay

## 2021-09-24 ENCOUNTER — Telehealth: Payer: Self-pay | Admitting: Internal Medicine

## 2021-09-24 DIAGNOSIS — I10 Essential (primary) hypertension: Secondary | ICD-10-CM

## 2021-09-24 MED ORDER — LISINOPRIL 20 MG PO TABS: 20.0000 mg | ORAL_TABLET | Freq: Every day | ORAL | 0 refills | Status: DC

## 2021-09-24 NOTE — Telephone Encounter (Signed)
Copied from CRM [id number]. Topic: General - Other >> Sep 24, 2021 10:25 AM Gaetana Michaelis A wrote: Reason for CRM: The patient would like to speak directly to Dr. Judithann Graves or a member of clinical staff within the practice  The patient would like to be prescribed lisinopril without HCTZ   Please contact further when possible

## 2021-09-24 NOTE — Telephone Encounter (Signed)
Patient informed. Will come in April at her scheduled appt for check up.

## 2021-09-24 NOTE — Telephone Encounter (Signed)
Waiting for Dr Gaspar Cola reply from previous patient call. Dr Army Melia was not in the office yesterday afternoon when she called the first time. Will call patient when Dr Army Melia responds about medication.

## 2021-11-10 ENCOUNTER — Telehealth: Payer: Self-pay | Admitting: Internal Medicine

## 2021-11-10 NOTE — Telephone Encounter (Signed)
Copied from CRM [id number]. Topic: General - Other ?>> Nov 10, 2021  4:09 PM Renee Graham wrote: ?Reason for CRM: Pt wanted to let Dr. Judithann Graves know that her Father that was also Dr. Jaclynn Guarneri pt before is sick and in ICU and she is in [place] and not sure when she will make it back in / FYI ?

## 2021-11-15 ENCOUNTER — Ambulatory Visit: Payer: BC Managed Care – PPO | Admitting: Internal Medicine

## 2021-11-27 ENCOUNTER — Other Ambulatory Visit: Payer: Self-pay | Admitting: Internal Medicine

## 2021-11-27 DIAGNOSIS — E782 Mixed hyperlipidemia: Secondary | ICD-10-CM

## 2021-11-29 NOTE — Telephone Encounter (Signed)
Requested Prescriptions  ?Pending Prescriptions Disp Refills  ?? atorvastatin (LIPITOR) 10 MG tablet [Pharmacy Med Name: ATORVASTATIN 10 MG TABLET] 90 tablet 1  ?  Sig: TAKE 1 TABLET BY MOUTH EVERY DAY  ?  ? Cardiovascular:  Antilipid - Statins Failed - 11/27/2021  9:23 AM  ?  ?  Failed - Lipid Panel in normal range within the last 12 months  ?  Cholesterol, Total  ?Date Value Ref Range Status  ?05/14/2021 206 (H) 100 - 199 mg/dL Final  ? ?LDL Chol Calc (NIH)  ?Date Value Ref Range Status  ?05/14/2021 117 (H) 0 - 99 mg/dL Final  ? ?HDL  ?Date Value Ref Range Status  ?05/14/2021 47 >39 mg/dL Final  ? ?Triglycerides  ?Date Value Ref Range Status  ?05/14/2021 243 (H) 0 - 149 mg/dL Final  ? ?  ?  ?  Passed - Patient is not pregnant  ?  ?  Passed - Valid encounter within last 12 months  ?  Recent Outpatient Visits   ?      ? 5 months ago Viral URI  ? [facility] Glean [facility], [facility]  ? 6 months ago Annual physical exam  ? [facility] Glean Hess, MD  ? 1 year ago Annual physical exam  ? [facility] Glean Hess, MD  ? 2 years ago Annual physical exam  ? [facility] Glean Hess, MD  ? 3 years ago Acute non-recurrent maxillary sinusitis  ? [facility] Glean Hess, MD  ?  ?  ?Future Appointments   ?        ? In 5 months Army Melia Jesse Sans, MD [facility]  ?  ? ?  ?  ?  ? ?

## 2021-12-20 ENCOUNTER — Other Ambulatory Visit: Payer: Self-pay | Admitting: Internal Medicine

## 2021-12-20 DIAGNOSIS — I10 Essential (primary) hypertension: Secondary | ICD-10-CM

## 2021-12-21 NOTE — Telephone Encounter (Signed)
Requested medications are due for refill today.  yes ? ?Requested medications are on the active medications list.  yes ? ?Last refill. 09/24/2021 #90 0 refills ? ?Future visit scheduled.   yes ? ?Notes to clinic.  Pt was supposed to follow up with Bp's at April appointment - Appointment cancelled.  ? ? ? ?Requested Prescriptions  ?Pending Prescriptions Disp Refills  ? lisinopril (ZESTRIL) 20 MG tablet [Pharmacy Med Name: LISINOPRIL 20 MG TABLET] 90 tablet 0  ?  Sig: TAKE 1 TABLET BY MOUTH EVERY DAY  ?  ? Cardiovascular:  ACE Inhibitors Failed - 12/20/2021  1:44 AM  ?  ?  Failed - Cr in normal range and within 180 days  ?  Creatinine, Ser  ?Date Value Ref Range Status  ?05/14/2021 0.88 0.57 - 1.00 mg/dL Final  ?   ?  ?  Failed - K in normal range and within 180 days  ?  Potassium  ?Date Value Ref Range Status  ?05/14/2021 4.0 3.5 - 5.2 mmol/L Final  ?   ?  ?  Failed - Valid encounter within last 6 months  ?  Recent Outpatient Visits   ? ?      ? 6 months ago Viral URI  ? [facility] Glean Hess, MD  ? 7 months ago Annual physical exam  ? [facility] Glean Hess, MD  ? 1 year ago Annual physical exam  ? [facility] Glean Hess, MD  ? 2 years ago Annual physical exam  ? [facility] Glean Hess, MD  ? 3 years ago Acute non-recurrent maxillary sinusitis  ? [facility] Glean Hess, MD  ? ?  ?  ?Future Appointments   ? ?        ? In 4 months Army Melia Jesse Sans, MD [facility]  ? ?  ? ? ?  ?  ?  Passed - Patient is not pregnant  ?  ?  Passed - Last BP in normal range  ?  BP Readings from Last 1 Encounters:  ?06/21/21 132/78  ?   ?  ?  ?  ?

## 2021-12-22 NOTE — Telephone Encounter (Signed)
Left voice mail to set up refill appointment patient also has an appointment for a physical in October. ?

## 2022-01-14 ENCOUNTER — Other Ambulatory Visit: Payer: Self-pay | Admitting: Internal Medicine

## 2022-01-14 DIAGNOSIS — I10 Essential (primary) hypertension: Secondary | ICD-10-CM

## 2022-01-14 NOTE — Telephone Encounter (Signed)
Requested medication (s) are due for refill today - no  Requested medication (s) are on the active medication list -yes  Future visit scheduled -no  Last refill: 12/21/21 #30  Notes to clinic: Patient has had courtesy RF- request sent for review   Requested Prescriptions  Pending Prescriptions Disp Refills   lisinopril (ZESTRIL) 20 MG tablet [Pharmacy Med Name: LISINOPRIL 20 MG TABLET] 30 tablet 0    Sig: TAKE 1 TABLET BY MOUTH EVERY DAY     Cardiovascular:  ACE Inhibitors Failed - 01/14/2022 12:34 PM      Failed - Cr in normal range and within 180 days    Creatinine, Ser  Date Value Ref Range Status  05/14/2021 0.88 0.57 - 1.00 mg/dL Final         Failed - K in normal range and within 180 days    Potassium  Date Value Ref Range Status  05/14/2021 4.0 3.5 - 5.2 mmol/L Final         Failed - Valid encounter within last 6 months    Recent Outpatient Visits           6 months ago Viral URI   [facility] Reubin Milan, MD   8 months ago Annual physical exam   [facility] Reubin Milan, MD   1 year ago Annual physical exam   [facility] Reubin Milan, MD   2 years ago Annual physical exam   [facility] Reubin Milan, MD   3 years ago Acute non-recurrent maxillary sinusitis   [facility] Reubin Milan, MD       Future Appointments             In 4 months Judithann Graves Nyoka Cowden, MD [facility], [facility]            Passed - Patient is not pregnant      Passed - Last BP in normal range    BP Readings from Last 1 Encounters:  06/21/21 132/78            Requested Prescriptions  Pending Prescriptions Disp Refills   lisinopril (ZESTRIL) 20 MG tablet [Pharmacy Med Name: LISINOPRIL 20 MG TABLET] 30 tablet 0    Sig: TAKE 1 TABLET BY MOUTH EVERY DAY     Cardiovascular:  ACE Inhibitors Failed - 01/14/2022 12:34 PM      Failed - Cr in normal range and within 180 days    Creatinine, Ser  Date Value  Ref Range Status  05/14/2021 0.88 0.57 - 1.00 mg/dL Final         Failed - K in normal range and within 180 days    Potassium  Date Value Ref Range Status  05/14/2021 4.0 3.5 - 5.2 mmol/L Final         Failed - Valid encounter within last 6 months    Recent Outpatient Visits           6 months ago Viral URI   [facility] Reubin Milan, MD   8 months ago Annual physical exam   [facility] Reubin Milan, MD   1 year ago Annual physical exam   [facility] Reubin Milan, MD   2 years ago Annual physical exam   [facility] Reubin Milan, MD   3 years ago Acute non-recurrent maxillary sinusitis   [facility] Reubin Milan, MD  Future Appointments             In 4 months Reubin Milan, MD [facility], [facility]            Passed - Patient is not pregnant      Passed - Last BP in normal range    BP Readings from Last 1 Encounters:  06/21/21 132/78

## 2022-03-31 DIAGNOSIS — L209 Atopic dermatitis, unspecified: Secondary | ICD-10-CM | POA: Diagnosis not present

## 2022-03-31 DIAGNOSIS — D485 Neoplasm of uncertain behavior of skin: Secondary | ICD-10-CM | POA: Diagnosis not present

## 2022-04-27 ENCOUNTER — Other Ambulatory Visit: Payer: Self-pay | Admitting: Internal Medicine

## 2022-04-27 DIAGNOSIS — I10 Essential (primary) hypertension: Secondary | ICD-10-CM

## 2022-04-27 NOTE — Telephone Encounter (Signed)
Appointment 05/17/22- extension #21 pills given Requested Prescriptions  Pending Prescriptions Disp Refills  . lisinopril (ZESTRIL) 20 MG tablet [Pharmacy Med Name: LISINOPRIL 20 MG TABLET] 21 tablet 0    Sig: TAKE 1 TABLET BY MOUTH EVERY DAY     Cardiovascular:  ACE Inhibitors Failed - 04/27/2022  1:25 AM      Failed - Cr in normal range and within 180 days    Creatinine, Ser  Date Value Ref Range Status  05/14/2021 0.88 0.57 - 1.00 mg/dL Final         Failed - K in normal range and within 180 days    Potassium  Date Value Ref Range Status  05/14/2021 4.0 3.5 - 5.2 mmol/L Final         Failed - Valid encounter within last 6 months    Recent Outpatient Visits          10 months ago Viral URI   [place] [facility], Jesse Sans, MD   11 months ago Annual physical exam   [place] [facility], Jesse Sans, MD   2 years ago Annual physical exam   [place] [facility] at [facility] Jesse Sans, MD   3 years ago Annual physical exam   [place] [facility], Jesse Sans, MD   3 years ago Acute non-recurrent maxillary sinusitis    [facility] at [facility], Jesse Sans, MD      Future Appointments            In 2 weeks Army Melia Jesse Sans, MD [facility] at [facility], [place] - Patient is not pregnant      Passed - Last BP in normal range    BP Readings from Last 1 Encounters:  06/21/21 132/78

## 2022-04-28 DIAGNOSIS — H00022 Hordeolum internum right lower eyelid: Secondary | ICD-10-CM | POA: Diagnosis not present

## 2022-05-12 ENCOUNTER — Telehealth: Payer: Self-pay | Admitting: *Deleted

## 2022-05-12 NOTE — Telephone Encounter (Signed)
Left message on voicemail to call to schedule  follow up [facility] CT scan.  

## 2022-05-17 ENCOUNTER — Encounter: Payer: Self-pay | Admitting: Internal Medicine

## 2022-05-17 ENCOUNTER — Ambulatory Visit (INDEPENDENT_AMBULATORY_CARE_PROVIDER_SITE_OTHER): Payer: BC Managed Care – PPO | Admitting: Internal Medicine

## 2022-05-17 VITALS — BP 145/94 | HR 69 | Ht 65.0 in | Wt 183.0 lb

## 2022-05-17 DIAGNOSIS — F17200 Nicotine dependence, unspecified, uncomplicated: Secondary | ICD-10-CM | POA: Diagnosis not present

## 2022-05-17 DIAGNOSIS — I10 Essential (primary) hypertension: Secondary | ICD-10-CM

## 2022-05-17 DIAGNOSIS — Z1211 Encounter for screening for malignant neoplasm of colon: Secondary | ICD-10-CM | POA: Diagnosis not present

## 2022-05-17 DIAGNOSIS — Z0000 Encounter for general adult medical examination without abnormal findings: Secondary | ICD-10-CM | POA: Diagnosis not present

## 2022-05-17 DIAGNOSIS — Z131 Encounter for screening for diabetes mellitus: Secondary | ICD-10-CM

## 2022-05-17 DIAGNOSIS — Z23 Encounter for immunization: Secondary | ICD-10-CM

## 2022-05-17 DIAGNOSIS — E782 Mixed hyperlipidemia: Secondary | ICD-10-CM

## 2022-05-17 DIAGNOSIS — Z1231 Encounter for screening mammogram for malignant neoplasm of breast: Secondary | ICD-10-CM

## 2022-05-17 LAB — POCT URINALYSIS DIPSTICK
Bilirubin, UA: NEGATIVE
Glucose, UA: NEGATIVE
Ketones, UA: NEGATIVE
Leukocytes, UA: NEGATIVE
Nitrite, UA: NEGATIVE
Protein, UA: NEGATIVE
Spec Grav, UA: 1.02 (ref 1.010–1.025)
Urobilinogen, UA: 0.2 E.U./dL
pH, UA: 7 (ref 5.0–8.0)

## 2022-05-17 MED ORDER — LISINOPRIL 40 MG PO TABS: 40.0000 mg | ORAL_TABLET | Freq: Every day | ORAL | 0 refills | Status: DC

## 2022-05-17 MED ORDER — ATORVASTATIN CALCIUM 10 MG PO TABS: 10.0000 mg | ORAL_TABLET | Freq: Every day | ORAL | 1 refills | Status: DC

## 2022-05-17 NOTE — Patient Instructions (Addendum)
Call [facility] to schedule your Mammogram at [phone].  Call to schedule the CT for lung cancer screening.

## 2022-05-17 NOTE — Progress Notes (Addendum)
Date:  05/17/2022   Name:  Renee Graham   DOB:  1960/12/16   MRN:  [id number]   Chief Complaint: Annual Exam (Breast exam no pap ) Renee Graham is a 61 y.o. female who presents today for her Complete Annual Exam. She feels well. She reports exercising none. She reports she is sleeping well. Breast complaints none.  She wants to get the flu vaccine plus Covid and Prevnar 20.  She is still grieving her father who passed away on Mother's day at age 58.  Mammogram: 10/2020 [facility] DEXA: none Pap smear: 05/2021 Colonoscopy: none!  Health Maintenance Due  Topic Date Due   Pneumococcal Vaccine 17-38 Years old (1 - PCV) Never done   TETANUS/TDAP  Never done   COLON CANCER SCREENING ANNUAL FOBT  Never done   COLONOSCOPY (Pts 45-72yrs Insurance coverage will need to be confirmed)  Never done   COVID-19 Vaccine (4 - Pfizer series) 04/29/2021   MAMMOGRAM  11/02/2021   INFLUENZA VACCINE  Never done    Immunization History  Administered Date(s) Administered   PFIZER Comirnaty(Gray Top)Covid-19 Tri-Sucrose Vaccine 03/04/2021   PFIZER(Purple Top)SARS-COV-2 Vaccination 10/16/2019, 11/04/2019   Zoster Recombinat (Shingrix) 01/20/2020, 04/06/2020    Hypertension This is a chronic problem. The problem is controlled. Pertinent negatives include no chest pain, headaches, palpitations or shortness of breath. Past treatments include ACE inhibitors.  Hyperlipidemia This is a chronic problem. The problem is controlled. Pertinent negatives include no chest pain or shortness of breath. Current antihyperlipidemic treatment includes statins. The current treatment provides moderate improvement of lipids.    Lab Results  Component Value Date   NA 139 05/14/2021   K 4.0 05/14/2021   CO2 23 05/14/2021   GLUCOSE 88 05/14/2021   BUN 11 05/14/2021   CREATININE 0.88 05/14/2021   CALCIUM 10.2 05/14/2021   EGFR 75 05/14/2021   GFRNONAA 76 01/20/2020   Lab Results  Component Value Date   CHOL 206 (H)  05/14/2021   HDL 47 05/14/2021   LDLCALC 117 (H) 05/14/2021   TRIG 243 (H) 05/14/2021   CHOLHDL 4.4 05/14/2021   Lab Results  Component Value Date   TSH 1.770 05/14/2021   No results found for: "HGBA1C" Lab Results  Component Value Date   WBC 8.3 05/14/2021   HGB 14.3 05/14/2021   HCT 43.3 05/14/2021   MCV 96 05/14/2021   PLT 298 05/14/2021   Lab Results  Component Value Date   ALT 21 05/14/2021   AST 33 05/14/2021   ALKPHOS 87 05/14/2021   BILITOT 0.5 05/14/2021   No results found for: "25OHVITD2", "25OHVITD3", "VD25OH"   Review of Systems  Constitutional:  Negative for chills, fatigue and fever.  HENT:  Negative for congestion, hearing loss, tinnitus, trouble swallowing and voice change.   Eyes:  Negative for visual disturbance.  Respiratory:  Negative for cough, chest tightness, shortness of breath and wheezing.   Cardiovascular:  Negative for chest pain, palpitations and leg swelling.  Gastrointestinal:  Negative for abdominal pain, constipation, diarrhea and vomiting.  Endocrine: Negative for polydipsia and polyuria.  Genitourinary:  Negative for dysuria, frequency, genital sores, vaginal bleeding and vaginal discharge.  Musculoskeletal:  Negative for arthralgias, gait problem and joint swelling.  Skin:  Positive for rash (recurrent rash felt to be "nerves" by Dermatology). Negative for color change.  Neurological:  Negative for dizziness, tremors, light-headedness and headaches.  Hematological:  Negative for adenopathy. Does not bruise/bleed easily.  Psychiatric/Behavioral:  Negative for dysphoric mood  and sleep disturbance. The patient is not nervous/anxious.     Patient Active Problem List   Diagnosis Date Noted   Hematuria, microscopic 02/29/2016   Benign essential HTN 05/14/2015   Tobacco use disorder 05/14/2015   H/O abnormal cervical Papanicolaou smear 05/14/2015   Migraine without aura and responsive to treatment 05/14/2015   Hyperlipidemia 05/14/2015     No Known Allergies  Past Surgical History:  Procedure Laterality Date   TUBAL LIGATION      Social History   Tobacco Use   Smoking status: Every Day    Packs/day: 1.00    Years: 25.00    Total pack years: 25.00    Types: Cigarettes   Smokeless tobacco: Current   Tobacco comments:    smoked earlier in life then quit and restarted 2008  Substance Use Topics   Alcohol use: Yes    Alcohol/week: 21.0 standard drinks of alcohol    Types: 21 Glasses of wine per week   Drug use: Never     Medication list has been reviewed and updated.  Current Meds  Medication Sig   ibuprofen (ADVIL,MOTRIN) 200 MG tablet Take 800 mg by mouth every 6 (six) hours as needed.   triamcinolone ointment (KENALOG) 0.1 % Apply topically 2 (two) times daily.   [DISCONTINUED] atorvastatin (LIPITOR) 10 MG tablet TAKE 1 TABLET BY MOUTH EVERY DAY   [DISCONTINUED] fluticasone (FLONASE) 50 MCG/ACT nasal spray Place 2 sprays into both nostrils daily.   [DISCONTINUED] lisinopril (ZESTRIL) 20 MG tablet TAKE 1 TABLET BY MOUTH EVERY DAY       05/17/2022   10:13 AM 05/14/2021    9:41 AM 01/20/2020    8:41 AM  GAD 7 : Generalized Anxiety Score  Nervous, Anxious, on Edge 0 1 0  Control/stop worrying _0 Worry too much - different things 0 1 1  Trouble relaxing 0 0 0  Restless 0 0 0  Easily annoyed or irritable 0 0 0  Afraid - awful might happen 0 0 0  Total GAD 7 Score _1 Anxiety Difficulty Not difficult at all Not difficult at all Not difficult at all       05/17/2022   10:12 AM 05/14/2021    9:41 AM 01/20/2020    8:40 AM  Depression screen PHQ 2/9  Decreased Interest 0 0 0  Down, Depressed, Hopeless 0 0 0  PHQ - 2 Score 0 0 0  Altered sleeping 0 1 1  Tired, decreased energy 0 1 0  Change in appetite 0 0 0  Feeling bad or failure about yourself  0 0 0  Trouble concentrating 0 0 0  Moving slowly or fidgety/restless 0 0 0  Suicidal thoughts 0 0 0  PHQ-9 Score 0 2 1  Difficult doing  work/chores Not difficult at all Not difficult at all Not difficult at all    BP Readings from Last 3 Encounters:  05/17/22 (!) 145/94  06/21/21 132/78  05/14/21 126/82    Physical Exam Vitals and nursing note reviewed.  Constitutional:      General: She is not in acute distress.    Appearance: She is well-developed.  HENT:     Head: Normocephalic and atraumatic.     Right Ear: Tympanic membrane and ear canal normal.     Left Ear: Tympanic membrane and ear canal normal.     Nose:     Right Sinus: No maxillary sinus tenderness.     Left Sinus: No  maxillary sinus tenderness.  Eyes:     General: No scleral icterus.       Right eye: No discharge.        Left eye: No discharge.     Conjunctiva/sclera: Conjunctivae normal.  Neck:     Thyroid: No thyromegaly.     Vascular: No carotid bruit.  Cardiovascular:     Rate and Rhythm: Normal rate and regular rhythm.     Pulses: Normal pulses.     Heart sounds: Normal heart sounds.  Pulmonary:     Effort: Pulmonary effort is normal. No respiratory distress.     Breath sounds: No wheezing.  Chest:  Breasts:    Right: No mass, nipple discharge, skin change or tenderness.     Left: No mass, nipple discharge, skin change or tenderness.  Abdominal:     General: Bowel sounds are normal.     Palpations: Abdomen is soft.     Tenderness: There is no abdominal tenderness.  Musculoskeletal:     Cervical back: Normal range of motion. No erythema.     Right lower leg: No edema.     Left lower leg: No edema.  Lymphadenopathy:     Cervical: No cervical adenopathy.  Skin:    General: Skin is warm and dry.     Findings: Erythema (scattered areas of redness with evidence of excoriation) present. No rash.  Neurological:     Mental Status: She is alert and oriented to person, place, and time.     Cranial Nerves: No cranial nerve deficit.     Sensory: No sensory deficit.     Deep Tendon Reflexes: Reflexes are normal and symmetric.  Psychiatric:         Attention and Perception: Attention normal.        Mood and Affect: Mood normal.     Wt Readings from Last 3 Encounters:  05/17/22 183 lb (83 kg)  06/21/21 182 lb (82.6 kg)  05/14/21 182 lb (82.6 kg)    BP (!) 145/94 (BP Location: Right Arm, Cuff Size: Large)   Pulse 69   Ht _0  (1.651 m)   Wt 183 lb (83 kg)   LMP 12/07/2015   SpO2 98%   BMI 30.45 kg/m   Assessment and Plan: 1. Annual physical exam Recommend the flu vaccine today and Prevnar at next visit Covid at the pharmacy in a few weeks - CBC with Differential/Platelet - Comprehensive metabolic panel - Hemoglobin A1c - Lipid panel - TSH - POCT urinalysis dipstick  2. Encounter for screening mammogram for breast cancer Schedule at [facility]  3. Colon cancer screening Refer to Dr. Comer Locket in [facility] - Ambulatory referral to Gastroenterology  4. Screening for diabetes mellitus - Hemoglobin A1c  5. Benign essential HTN BP not well controlled - will increase Lisinopril to 40 mg since the HCTZ was removed. - CBC with Differential/Platelet - Comprehensive metabolic panel - TSH - POCT urinalysis dipstick - lisinopril (ZESTRIL) 40 MG tablet; Take 1 tablet (40 mg total) by mouth daily.  Dispense: 90 tablet; Refill: 0  6. Mixed hyperlipidemia - Lipid panel - atorvastatin (LIPITOR) 10 MG tablet; Take 1 tablet (10 mg total) by mouth daily.  Dispense: 90 tablet; Refill: 1  7. Tobacco use disorder Continue efforts to quit. She has been referred for LDCT screening and will call back to schedule   Partially dictated using Editor, commissioning. Any errors are unintentional.  Halina Maidens, MD [facility]  05/17/2022

## 2022-05-18 LAB — CBC WITH DIFFERENTIAL/PLATELET
Basophils Absolute: 0.1 10*3/uL (ref 0.0–0.2)
Basos: 1 %
EOS (ABSOLUTE): 0.1 10*3/uL (ref 0.0–0.4)
Eos: 1 %
Hematocrit: 42.9 % (ref 34.0–46.6)
Hemoglobin: 14.7 g/dL (ref 11.1–15.9)
Immature Grans (Abs): 0 10*3/uL (ref 0.0–0.1)
Immature Granulocytes: 0 %
Lymphocytes Absolute: 2.5 10*3/uL (ref 0.7–3.1)
Lymphs: 28 %
MCH: 32.6 pg (ref 26.6–33.0)
MCHC: 34.3 g/dL (ref 31.5–35.7)
MCV: 95 fL (ref 79–97)
Monocytes Absolute: 0.6 10*3/uL (ref 0.1–0.9)
Monocytes: 7 %
Neutrophils Absolute: 5.4 10*3/uL (ref 1.4–7.0)
Neutrophils: 63 %
Platelets: 266 10*3/uL (ref 150–450)
RBC: 4.51 x10E6/uL (ref 3.77–5.28)
RDW: 12.1 % (ref 11.7–15.4)
WBC: 8.7 10*3/uL (ref 3.4–10.8)

## 2022-05-18 LAB — LIPID PANEL
Chol/HDL Ratio: 3.3 ratio (ref 0.0–4.4)
Cholesterol, Total: 183 mg/dL (ref 100–199)
HDL: 56 mg/dL (ref >39)
LDL Chol Calc (NIH): 97 mg/dL (ref 0–99)
Triglycerides: 172 mg/dL — ABNORMAL HIGH (ref 0–149)
VLDL Cholesterol Cal: 30 mg/dL (ref 5–40)

## 2022-05-18 LAB — COMPREHENSIVE METABOLIC PANEL
ALT: 20 IU/L (ref 0–32)
AST: 31 IU/L (ref 0–40)
Albumin/Globulin Ratio: 1.8 (ref 1.2–2.2)
Albumin: 4.3 g/dL (ref 3.9–4.9)
Alkaline Phosphatase: 96 IU/L (ref 44–121)
BUN/Creatinine Ratio: 13 (ref 12–28)
BUN: 11 mg/dL (ref 8–27)
Bilirubin Total: 0.6 mg/dL (ref 0.0–1.2)
CO2: 25 mmol/L (ref 20–29)
Calcium: 10.2 mg/dL (ref 8.7–10.3)
Chloride: 106 mmol/L (ref 96–106)
Creatinine, Ser: 0.82 mg/dL (ref 0.57–1.00)
Globulin, Total: 2.4 g/dL (ref 1.5–4.5)
Glucose: 80 mg/dL (ref 70–99)
Potassium: 4.4 mmol/L (ref 3.5–5.2)
Sodium: 145 mmol/L — ABNORMAL HIGH (ref 134–144)
Total Protein: 6.7 g/dL (ref 6.0–8.5)
eGFR: 81 mL/min/{1.73_m2} (ref >59)

## 2022-05-18 LAB — HEMOGLOBIN A1C
Est. average glucose Bld gHb Est-mCnc: 103 mg/dL
Hgb A1c MFr Bld: 5.2 % (ref 4.8–5.6)

## 2022-05-18 LAB — TSH: TSH: 1.32 u[IU]/mL (ref 0.450–4.500)

## 2022-07-19 ENCOUNTER — Ambulatory Visit: Payer: BC Managed Care – PPO | Admitting: Internal Medicine

## 2022-07-19 NOTE — Progress Notes (Deleted)
Date:  07/19/2022   Name:  Renee Graham   DOB:  August 27, 1960   MRN:  [id number]   Chief Complaint: No chief complaint on file.  Hypertension This is a chronic problem. The problem is resistant. Pertinent negatives include no chest pain, headaches, palpitations or shortness of breath. Past treatments include ACE inhibitors (Lisinopril dose increased to 40 mg last visit). The current treatment provides moderate improvement. There is no history of kidney disease, CAD/MI or CVA.    Lab Results  Component Value Date   NA 145 (H) 05/17/2022   K 4.4 05/17/2022   CO2 25 05/17/2022   GLUCOSE 80 05/17/2022   BUN 11 05/17/2022   CREATININE 0.82 05/17/2022   CALCIUM 10.2 05/17/2022   EGFR 81 05/17/2022   GFRNONAA 76 01/20/2020   Lab Results  Component Value Date   CHOL 183 05/17/2022   HDL 56 05/17/2022   LDLCALC 97 05/17/2022   TRIG 172 (H) 05/17/2022   CHOLHDL 3.3 05/17/2022   Lab Results  Component Value Date   TSH 1.320 05/17/2022   Lab Results  Component Value Date   HGBA1C 5.2 05/17/2022   Lab Results  Component Value Date   WBC 8.7 05/17/2022   HGB 14.7 05/17/2022   HCT 42.9 05/17/2022   MCV 95 05/17/2022   PLT 266 05/17/2022   Lab Results  Component Value Date   ALT 20 05/17/2022   AST 31 05/17/2022   ALKPHOS 96 05/17/2022   BILITOT 0.6 05/17/2022   No results found for: "25OHVITD2", "25OHVITD3", "VD25OH"   Review of Systems  Constitutional:  Negative for fatigue and unexpected weight change.  HENT:  Negative for nosebleeds.   Eyes:  Negative for visual disturbance.  Respiratory:  Negative for cough, chest tightness, shortness of breath and wheezing.   Cardiovascular:  Negative for chest pain, palpitations and leg swelling.  Gastrointestinal:  Negative for abdominal pain, constipation and diarrhea.  Neurological:  Negative for dizziness, weakness, light-headedness and headaches.    Patient Active Problem List   Diagnosis Date Noted   Hematuria,  microscopic 02/29/2016   Benign essential HTN 05/14/2015   Tobacco use disorder 05/14/2015   H/O abnormal cervical Papanicolaou smear 05/14/2015   Migraine without aura and responsive to treatment 05/14/2015   Hyperlipidemia 05/14/2015    No Known Allergies  Past Surgical History:  Procedure Laterality Date   TUBAL LIGATION      Social History   Tobacco Use   Smoking status: Every Day    Packs/day: 1.00    Years: 25.00    Total pack years: 25.00    Types: Cigarettes   Smokeless tobacco: Current   Tobacco comments:    smoked earlier in life then quit and restarted 2008  Substance Use Topics   Alcohol use: Yes    Alcohol/week: 21.0 standard drinks of alcohol    Types: 21 Glasses of wine per week   Drug use: Never     Medication list has been reviewed and updated.  No outpatient medications have been marked as taking for the 07/19/22 encounter (Appointment) with Glean Hess, MD.       05/17/2022   10:13 AM 05/14/2021    9:41 AM 01/20/2020    8:41 AM  GAD 7 : Generalized Anxiety Score  Nervous, Anxious, on Edge 0 1 0  Control/stop worrying _0 Worry too much - different things 0 1 1  Trouble relaxing 0 0 0  Restless 0 0 0  Easily annoyed or irritable 0 0 0  Afraid - awful might happen 0 0 0  Total GAD 7 Score _0 Anxiety Difficulty Not difficult at all Not difficult at all Not difficult at all       05/17/2022   10:12 AM 05/14/2021    9:41 AM 01/20/2020    8:40 AM  Depression screen PHQ 2/9  Decreased Interest 0 0 0  Down, Depressed, Hopeless 0 0 0  PHQ - 2 Score 0 0 0  Altered sleeping 0 1 1  Tired, decreased energy 0 1 0  Change in appetite 0 0 0  Feeling bad or failure about yourself  0 0 0  Trouble concentrating 0 0 0  Moving slowly or fidgety/restless 0 0 0  Suicidal thoughts 0 0 0  PHQ-9 Score 0 2 1  Difficult doing work/chores Not difficult at all Not difficult at all Not difficult at all    BP Readings from Last 3 Encounters:   05/17/22 (!) 145/94  06/21/21 132/78  05/14/21 126/82    Physical Exam Vitals and nursing note reviewed.  Constitutional:      General: She is not in acute distress.    Appearance: She is well-developed.  HENT:     Head: Normocephalic and atraumatic.  Pulmonary:     Effort: Pulmonary effort is normal. No respiratory distress.  Skin:    General: Skin is warm and dry.     Findings: No rash.  Neurological:     Mental Status: She is alert and oriented to person, place, and time.  Psychiatric:        Mood and Affect: Mood normal.        Behavior: Behavior normal.     Wt Readings from Last 3 Encounters:  05/17/22 183 lb (83 kg)  06/21/21 182 lb (82.6 kg)  05/14/21 182 lb (82.6 kg)    LMP 12/07/2015   Assessment and Plan:

## 2022-08-05 ENCOUNTER — Encounter: Payer: Self-pay | Admitting: Internal Medicine

## 2022-08-05 ENCOUNTER — Ambulatory Visit: Payer: BC Managed Care – PPO | Admitting: Internal Medicine

## 2022-08-05 VITALS — BP 124/84 | HR 76 | Ht 65.0 in | Wt 185.0 lb

## 2022-08-05 DIAGNOSIS — F17200 Nicotine dependence, unspecified, uncomplicated: Secondary | ICD-10-CM

## 2022-08-05 DIAGNOSIS — I10 Essential (primary) hypertension: Secondary | ICD-10-CM | POA: Diagnosis not present

## 2022-08-05 DIAGNOSIS — E782 Mixed hyperlipidemia: Secondary | ICD-10-CM | POA: Diagnosis not present

## 2022-08-05 DIAGNOSIS — Z23 Encounter for immunization: Secondary | ICD-10-CM

## 2022-08-05 MED ORDER — LISINOPRIL 40 MG PO TABS: 40.0000 mg | ORAL_TABLET | Freq: Every day | ORAL | 1 refills | Status: DC

## 2022-08-05 NOTE — Assessment & Plan Note (Signed)
LDCT screening once in 2021 but none since She continues to smoke - rec resume lung cancer screening

## 2022-08-05 NOTE — Patient Instructions (Signed)
Call [facility] to schedule your Mammogram at [phone].  

## 2022-08-05 NOTE — Progress Notes (Signed)
Date:  08/05/2022   Name:  Renee Graham   DOB:  [birth year]   MRN:  [id number]   Chief Complaint: Hypertension  Hypertension This is a chronic problem. The problem is resistant. Pertinent negatives include no chest pain, headaches, palpitations, peripheral edema, shortness of breath or sweats. Past treatments include ACE inhibitors (lisinopril 40 mg). The current treatment provides significant improvement.    Lab Results  Component Value Date   NA 145 (H) 05/17/2022   K 4.4 05/17/2022   CO2 25 05/17/2022   GLUCOSE 80 05/17/2022   BUN 11 05/17/2022   CREATININE 0.82 05/17/2022   CALCIUM 10.2 05/17/2022   EGFR 81 05/17/2022   GFRNONAA 76 01/20/2020   Lab Results  Component Value Date   CHOL 183 05/17/2022   HDL 56 05/17/2022   LDLCALC 97 05/17/2022   TRIG 172 (H) 05/17/2022   CHOLHDL 3.3 05/17/2022   Lab Results  Component Value Date   TSH 1.320 05/17/2022   Lab Results  Component Value Date   HGBA1C 5.2 05/17/2022   Lab Results  Component Value Date   WBC 8.7 05/17/2022   HGB 14.7 05/17/2022   HCT 42.9 05/17/2022   MCV 95 05/17/2022   PLT 266 05/17/2022   Lab Results  Component Value Date   ALT 20 05/17/2022   AST 31 05/17/2022   ALKPHOS 96 05/17/2022   BILITOT 0.6 05/17/2022   No results found for: "25OHVITD2", "25OHVITD3", "VD25OH"   Review of Systems  Constitutional:  Negative for fatigue and unexpected weight change.  HENT:  Negative for nosebleeds.   Eyes:  Negative for visual disturbance.  Respiratory:  Negative for cough, chest tightness, shortness of breath and wheezing.   Cardiovascular:  Negative for chest pain, palpitations and leg swelling.  Gastrointestinal:  Negative for abdominal pain, constipation and diarrhea.  Neurological:  Negative for dizziness, weakness, light-headedness and headaches.    Patient Active Problem List   Diagnosis Date Noted   Hematuria, microscopic 02/29/2016   Benign essential HTN 05/14/2015   Tobacco use  disorder 05/14/2015   H/O abnormal cervical Papanicolaou smear 05/14/2015   Migraine without aura and responsive to treatment 05/14/2015   Mixed hyperlipidemia 05/14/2015    No Known Allergies  Past Surgical History:  Procedure Laterality Date   TUBAL LIGATION      Social History   Tobacco Use   Smoking status: Every Day    Packs/day: 1.00    Years: 25.00    Total pack years: 25.00    Types: Cigarettes   Smokeless tobacco: Current   Tobacco comments:    smoked earlier in life then quit and restarted 2008  Substance Use Topics   Alcohol use: Yes    Alcohol/week: 21.0 standard drinks of alcohol    Types: 21 Glasses of wine per week   Drug use: Never     Medication list has been reviewed and updated.  Current Meds  Medication Sig   atorvastatin (LIPITOR) 10 MG tablet Take 1 tablet (10 mg total) by mouth daily.   ibuprofen (ADVIL,MOTRIN) 200 MG tablet Take 800 mg by mouth every 6 (six) hours as needed.   triamcinolone ointment (KENALOG) 0.1 % Apply topically 2 (two) times daily.   [DISCONTINUED] lisinopril (ZESTRIL) 40 MG tablet Take 1 tablet (40 mg total) by mouth daily.       08/05/2022    1:34 PM 05/17/2022   10:13 AM 05/14/2021    9:41 AM 01/20/2020    8:41 AM  GAD 7 : Generalized Anxiety Score  Nervous, Anxious, on Edge 0 0 1 0  Control/stop worrying 0 _0 Worry too much - different things 0 0 1 1  Trouble relaxing 0 0 0 0  Restless 0 0 0 0  Easily annoyed or irritable 0 0 0 0  Afraid - awful might happen 0 0 0 0  Total GAD 7 Score 0 _1 Anxiety Difficulty Not difficult at all Not difficult at all Not difficult at all Not difficult at all       08/05/2022    1:34 PM 05/17/2022   10:12 AM 05/14/2021    9:41 AM  Depression screen PHQ 2/9  Decreased Interest 0 0 0  Down, Depressed, Hopeless 0 0 0  PHQ - 2 Score 0 0 0  Altered sleeping 0 0 1  Tired, decreased energy 1 0 1  Change in appetite 0 0 0  Feeling bad or failure about yourself  0 0 0   Trouble concentrating 0 0 0  Moving slowly or fidgety/restless 0 0 0  Suicidal thoughts 0 0 0  PHQ-9 Score 1 0 2  Difficult doing work/chores Not difficult at all Not difficult at all Not difficult at all    BP Readings from Last 3 Encounters:  08/05/22 124/84  05/17/22 (!) 145/94  06/21/21 132/78    Physical Exam Vitals and nursing note reviewed.  Constitutional:      General: She is not in acute distress.    Appearance: She is well-developed.  HENT:     Head: Normocephalic and atraumatic.  Cardiovascular:     Rate and Rhythm: Normal rate and regular rhythm.     Pulses: Normal pulses.     Heart sounds: No murmur heard. Pulmonary:     Effort: Pulmonary effort is normal. No respiratory distress.     Breath sounds: No wheezing or rhonchi.  Musculoskeletal:     Cervical back: Normal range of motion.     Right lower leg: No edema.     Left lower leg: No edema.  Skin:    General: Skin is warm and dry.     Capillary Refill: Capillary refill takes less than 2 seconds.     Findings: No rash.  Neurological:     General: No focal deficit present.     Mental Status: She is alert and oriented to person, place, and time.  Psychiatric:        Mood and Affect: Mood normal.        Behavior: Behavior normal.     Wt Readings from Last 3 Encounters:  08/05/22 185 lb (83.9 kg)  05/17/22 183 lb (83 kg)  06/21/21 182 lb (82.6 kg)    BP 124/84 (BP Location: Right Arm, Cuff Size: Normal)   Pulse 76   Ht _2  (1.651 m)   Wt 185 lb (83.9 kg)   LMP 12/07/2015   SpO2 97%   BMI 30.79 kg/m   Assessment and Plan: Problem List Items Addressed This Visit       Cardiovascular and Mediastinum   Benign essential HTN - Primary (Chronic)    Clinically stable exam with well controlled BP on higher dose lisinopril 40 mg. Tolerating medications without side effects at this time. Pt to continue current regimen and low sodium diet; benefits of regular exercise as able discussed.        Relevant Medications   lisinopril (ZESTRIL) 40 MG tablet  Other   Mixed hyperlipidemia    Tolerating statin medication without side effects at this time. On Atorvastatin since 2020 Continue same therapy without change at this time.       Relevant Medications   lisinopril (ZESTRIL) 40 MG tablet   Tobacco use disorder (Chronic)    LDCT screening once in 2021 but none since She continues to smoke - rec resume lung cancer screening      Other Visit Diagnoses     Encounter for immunization       Relevant Orders   [employer] Fall 2023 Covid-19 Vaccine 53yr and older (Completed)        Partially dictated using DEditor, commissioning Any errors are unintentional.  LHalina Maidens MD [email or web address]  08/05/2022

## 2022-08-05 NOTE — Assessment & Plan Note (Signed)
Clinically stable exam with well controlled BP on higher dose lisinopril 40 mg. Tolerating medications without side effects at this time. Pt to continue current regimen and low sodium diet; benefits of regular exercise as able discussed.

## 2022-08-05 NOTE — Assessment & Plan Note (Signed)
Tolerating statin medication without side effects at this time. On Atorvastatin since 2020 Continue same therapy without change at this time.

## 2022-10-05 DIAGNOSIS — M6281 Muscle weakness (generalized): Secondary | ICD-10-CM | POA: Diagnosis not present

## 2022-10-05 DIAGNOSIS — M25562 Pain in left knee: Secondary | ICD-10-CM | POA: Diagnosis not present

## 2022-10-12 DIAGNOSIS — M1712 Unilateral primary osteoarthritis, left knee: Secondary | ICD-10-CM | POA: Diagnosis not present

## 2022-10-12 DIAGNOSIS — M7122 Synovial cyst of popliteal space [Baker], left knee: Secondary | ICD-10-CM | POA: Diagnosis not present

## 2022-10-12 DIAGNOSIS — M25562 Pain in left knee: Secondary | ICD-10-CM | POA: Diagnosis not present

## 2022-10-12 DIAGNOSIS — S83242A Other tear of medial meniscus, current injury, left knee, initial encounter: Secondary | ICD-10-CM | POA: Diagnosis not present

## 2022-10-14 DIAGNOSIS — Z1231 Encounter for screening mammogram for malignant neoplasm of breast: Secondary | ICD-10-CM | POA: Diagnosis not present

## 2022-10-14 DIAGNOSIS — R92323 Mammographic fibroglandular density, bilateral breasts: Secondary | ICD-10-CM | POA: Diagnosis not present

## 2022-10-17 DIAGNOSIS — M6281 Muscle weakness (generalized): Secondary | ICD-10-CM | POA: Diagnosis not present

## 2022-10-17 DIAGNOSIS — M25562 Pain in left knee: Secondary | ICD-10-CM | POA: Diagnosis not present

## 2022-11-01 DIAGNOSIS — M1711 Unilateral primary osteoarthritis, right knee: Secondary | ICD-10-CM | POA: Diagnosis not present

## 2022-11-01 DIAGNOSIS — M25461 Effusion, right knee: Secondary | ICD-10-CM | POA: Diagnosis not present

## 2022-11-08 DIAGNOSIS — M6281 Muscle weakness (generalized): Secondary | ICD-10-CM | POA: Diagnosis not present

## 2022-11-08 DIAGNOSIS — M25562 Pain in left knee: Secondary | ICD-10-CM | POA: Diagnosis not present

## 2022-11-08 DIAGNOSIS — M25561 Pain in right knee: Secondary | ICD-10-CM | POA: Diagnosis not present

## 2022-11-21 DIAGNOSIS — M25561 Pain in right knee: Secondary | ICD-10-CM | POA: Diagnosis not present

## 2022-11-21 DIAGNOSIS — M6281 Muscle weakness (generalized): Secondary | ICD-10-CM | POA: Diagnosis not present

## 2022-11-21 DIAGNOSIS — M25562 Pain in left knee: Secondary | ICD-10-CM | POA: Diagnosis not present

## 2022-12-05 ENCOUNTER — Ambulatory Visit: Payer: Self-pay | Admitting: *Deleted

## 2022-12-05 DIAGNOSIS — M25561 Pain in right knee: Secondary | ICD-10-CM | POA: Diagnosis not present

## 2022-12-05 DIAGNOSIS — M25562 Pain in left knee: Secondary | ICD-10-CM | POA: Diagnosis not present

## 2022-12-05 DIAGNOSIS — M6281 Muscle weakness (generalized): Secondary | ICD-10-CM | POA: Diagnosis not present

## 2022-12-05 NOTE — Telephone Encounter (Addendum)
  Chief Complaint: Left calf is swollen and very tight.   She saw her physical therapist today and he does not feel like she has a blood clot but wants her to follow up with her PCP.    Her left ankle is also swollen.   Never had that before. Symptoms: Left calf is tight but is not painful, red, warmer than other calf.    She does have discomfort when her foot is pulled up in her calf.   It's not a pain but a discomfort.    Frequency: Saw PT today.   Pertinent Negatives: Patient denies having redness, unequal temperature in her calves.   Disposition: [] ED /[] Urgent Care (no appt availability in office) / [] Appointment(In office/virtual)/ []  [facility]/ [] Home Care/ [] Refused Recommended Disposition /[]  Mobile Bus/ [x]  Follow-up with PCP Additional Notes: She has a trip coming up on July 8th where she is going to be walking a lot.   She really wants to be sure her leg is ok before leaving.  She requests to only see Dr. Judithann Graham.   "She knows what all is going on with me".   No appts. Available within the timeframe she needs to be seen.     Message sent to see if she can be worked in.   Asking about an ultrasound.   Do I need that done?  Pt. Was agreeable to someone calling her back about seeing Dr. Judithann Graham.    I called into the practice and made them aware I had sent this high priority.

## 2022-12-05 NOTE — Telephone Encounter (Signed)
Message from Randol Kern sent at 12/05/2022  2:38 PM EDT  Summary: Swelling   Wants to update her PCP on her follow up from PT, he has ruled out her having a blood clot. She is experiencing swelling without pain.  Best contact: [phone]          Call History   Type Contact Phone/Fax User  12/05/2022 02:37 PM EDT Phone (Incoming) Renee Graham (Self) [phone] Ailene Rud   Reason for Disposition  [1] Thigh, calf, or ankle swelling AND [2] only 1 side  Answer Assessment - Initial Assessment Questions 1. ONSET: "When did the pain start?"      I saw orthopedic and did sonogram in office.   Did x ray and saw no arthritis.   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     We could do MRI but it's expensive.    He gave me a steroid shot.   It helped a lot. The other leg same thing happened.   Steroid shot helped. Calves on both legs now are tight.   Left ankle was swollen.   My calf is tight.   Very tight.   No pain.   I saw my PT.    They recommended I come in and be seen.    No change in color.    Temperature is same in both legs.    The ankle swelling concerns me.    It's a discomfort in my calf when I pull my foot up.     2. LOCATION: "Where is the pain located?"      Left calf is very tight and I have left ankle swelling.  3. PAIN: "How bad is the pain?"    (Scale 1-10; or mild, moderate, severe)   -  MILD (1-3): doesn't interfere with normal activities    -  MODERATE (4-7): interferes with normal activities (e.g., work or school) or awakens from sleep, limping    -  SEVERE (8-10): excruciating pain, unable to do any normal activities, unable to walk     Moderate 4. WORK OR EXERCISE: "Has there been any recent work or exercise that involved this part of the body?"      I was on my legs 4 days straight on my job.   5. CAUSE: "What do you think is causing the leg pain?"     I don't know.    I have a trip a coming up so.    6. OTHER SYMPTOMS: "Do you have any other  symptoms?" (e.g., chest pain, back pain, breathing difficulty, swelling, rash, fever, numbness, weakness)     No shortness of breath or chest pain.    7. PREGNANCY: "Is there any chance you are pregnant?" "When was your last menstrual period?"     N/A due to age  Protocols used: Leg Pain-A-AH

## 2022-12-06 ENCOUNTER — Ambulatory Visit: Payer: BC Managed Care – PPO | Admitting: Internal Medicine

## 2022-12-06 ENCOUNTER — Encounter: Payer: Self-pay | Admitting: Internal Medicine

## 2022-12-06 VITALS — BP 132/86 | HR 85 | Ht 65.0 in | Wt 191.0 lb

## 2022-12-06 DIAGNOSIS — M7989 Other specified soft tissue disorders: Secondary | ICD-10-CM | POA: Diagnosis not present

## 2022-12-06 DIAGNOSIS — R2242 Localized swelling, mass and lump, left lower limb: Secondary | ICD-10-CM

## 2022-12-06 DIAGNOSIS — I82409 Acute embolism and thrombosis of unspecified deep veins of unspecified lower extremity: Secondary | ICD-10-CM | POA: Diagnosis not present

## 2022-12-06 NOTE — Telephone Encounter (Signed)
Pt scheduled for 1:40 PM today.  - Renee Graham

## 2022-12-06 NOTE — Progress Notes (Signed)
Date:  12/06/2022   Name:  Renee Graham   DOB:  22-Jul-1961   MRN:  [id number]   Chief Complaint: Leg Pain (Left calf swollen and painful.)  Leg Pain  There was no injury mechanism. The pain is present in the left leg. The quality of the pain is described as aching (swollen, warm and mildly tender). The pain is mild.  She has been having PT for left meniscal degeneration.  Recent cortisone injections in each knee - about one month ago.  Had an office [place] at [facility] and may have made not of Baker's cyst.  She has been standing more at work, no prolonged sitting, travel or immobility.  Lab Results  Component Value Date   NA 145 (H) 05/17/2022   K 4.4 05/17/2022   CO2 25 05/17/2022   GLUCOSE 80 05/17/2022   BUN 11 05/17/2022   CREATININE 0.82 05/17/2022   CALCIUM 10.2 05/17/2022   EGFR 81 05/17/2022   GFRNONAA 76 01/20/2020   Lab Results  Component Value Date   CHOL 183 05/17/2022   HDL 56 05/17/2022   LDLCALC 97 05/17/2022   TRIG 172 (H) 05/17/2022   CHOLHDL 3.3 05/17/2022   Lab Results  Component Value Date   TSH 1.320 05/17/2022   Lab Results  Component Value Date   HGBA1C 5.2 05/17/2022   Lab Results  Component Value Date   WBC 8.7 05/17/2022   HGB 14.7 05/17/2022   HCT 42.9 05/17/2022   MCV 95 05/17/2022   PLT 266 05/17/2022   Lab Results  Component Value Date   ALT 20 05/17/2022   AST 31 05/17/2022   ALKPHOS 96 05/17/2022   BILITOT 0.6 05/17/2022   No results found for: "25OHVITD2", "25OHVITD3", "VD25OH"   Review of Systems  Constitutional:  Negative for chills, fatigue and fever.  Respiratory:  Negative for chest tightness and shortness of breath.   Cardiovascular:  Positive for leg swelling. Negative for chest pain.    Patient Active Problem List   Diagnosis Date Noted   Hematuria, microscopic 02/29/2016   Benign essential HTN 05/14/2015   Tobacco use disorder 05/14/2015   H/O abnormal cervical Papanicolaou smear 05/14/2015   Migraine without  aura and responsive to treatment 05/14/2015   Mixed hyperlipidemia 05/14/2015    No Known Allergies  Past Surgical History:  Procedure Laterality Date   TUBAL LIGATION      Social History   Tobacco Use   Smoking status: Every Day    Packs/day: 1.00    Years: 25.00    Additional pack years: 0.00    Total pack years: 25.00    Types: Cigarettes   Smokeless tobacco: Current   Tobacco comments:    smoked earlier in life then quit and restarted 2008  Vaping Use   Vaping Use: Never used  Substance Use Topics   Alcohol use: Yes    Alcohol/week: 21.0 standard drinks of alcohol    Types: 21 Glasses of wine per week   Drug use: Never     Medication list has been reviewed and updated.  Current Meds  Medication Sig   atorvastatin (LIPITOR) 10 MG tablet Take 1 tablet (10 mg total) by mouth daily.   ibuprofen (ADVIL,MOTRIN) 200 MG tablet Take 800 mg by mouth every 6 (six) hours as needed.   lisinopril (ZESTRIL) 40 MG tablet Take 1 tablet (40 mg total) by mouth daily.   [DISCONTINUED] triamcinolone ointment (KENALOG) 0.1 % Apply topically 2 (two) times daily.  12/06/2022    1:44 PM 08/05/2022    1:34 PM 05/17/2022   10:13 AM 05/14/2021    9:41 AM  GAD 7 : Generalized Anxiety Score  Nervous, Anxious, on Edge 0 0 0 1  Control/stop worrying 0 0 1 1  Worry too much - different things 0 0 0 1  Trouble relaxing 0 0 0 0  Restless 1 0 0 0  Easily annoyed or irritable 0 0 0 0  Afraid - awful might happen 0 0 0 0  Total GAD 7 Score 1 0 1 3  Anxiety Difficulty Not difficult at all Not difficult at all Not difficult at all Not difficult at all       12/06/2022    1:44 PM 08/05/2022    1:34 PM 05/17/2022   10:12 AM  Depression screen PHQ 2/9  Decreased Interest 0 0 0  Down, Depressed, Hopeless 0 0 0  PHQ - 2 Score 0 0 0  Altered sleeping 1 0 0  Tired, decreased energy 3 1 0  Change in appetite 0 0 0  Feeling bad or failure about yourself  0 0 0  Trouble concentrating 0  0 0  Moving slowly or fidgety/restless 0 0 0  Suicidal thoughts 0 0 0  PHQ-9 Score 4 1 0  Difficult doing work/chores Not difficult at all Not difficult at all Not difficult at all    BP Readings from Last 3 Encounters:  12/06/22 132/86  08/05/22 124/84  05/17/22 (!) 145/94    Physical Exam Vitals and nursing note reviewed.  Constitutional:      General: She is not in acute distress.    Appearance: She is well-developed.  HENT:     Head: Normocephalic and atraumatic.  Pulmonary:     Effort: Pulmonary effort is normal. No respiratory distress.  Musculoskeletal:     Left lower leg: Swelling and tenderness present. Edema present.     Comments: Tender posterior left knee and calf Homans' sign equivocal  Skin:    General: Skin is warm and dry.     Findings: No rash.  Neurological:     Mental Status: She is alert and oriented to person, place, and time.  Psychiatric:        Mood and Affect: Mood normal.        Behavior: Behavior normal.     Wt Readings from Last 3 Encounters:  12/06/22 191 lb (86.6 kg)  08/05/22 185 lb (83.9 kg)  05/17/22 183 lb (83 kg)    BP 132/86 (BP Location: Right Arm, Cuff Size: Normal)   Pulse 85   Ht 5\' 5"  (1.651 m)   Wt 191 lb (86.6 kg)   LMP 12/07/2015   SpO2 98%   BMI 31.78 kg/m   Assessment and Plan:  Problem List Items Addressed This Visit   None Visit Diagnoses     Localized swelling of left lower leg    -  Primary   unable to get [place] here today - will send to [facility] in [place] since her husband is [facility] after cardiac procedure.   Relevant Orders   US Venous Img Lower Unilateral Left (DVT)     Patient called back - won't go to [facility]. [place] scheduled for 12/08/22 at 2:50.  No follow-ups on file.   Partially dictated using Dragon software, any errors are not intentional.  Reubin Milan, MD [facility] [place], [place]

## 2022-12-07 ENCOUNTER — Telehealth: Payer: Self-pay | Admitting: Internal Medicine

## 2022-12-07 NOTE — Telephone Encounter (Signed)
Patient informed. Verbalized understanding. - Renee Graham

## 2022-12-07 NOTE — Telephone Encounter (Signed)
Let her know that she does not have a blood clot or DVT.  She does have a Baker's cyst which has ruptured and caused the swelling.  Elevate the leg, take tylenol or Advil and the swelling will resolve on its own over the next several weeks.  No surgical intervention is needed.

## 2022-12-08 ENCOUNTER — Ambulatory Visit: Payer: BC Managed Care – PPO

## 2023-01-30 DIAGNOSIS — M25561 Pain in right knee: Secondary | ICD-10-CM | POA: Diagnosis not present

## 2023-01-30 DIAGNOSIS — M23204 Derangement of unspecified medial meniscus due to old tear or injury, left knee: Secondary | ICD-10-CM | POA: Diagnosis not present

## 2023-01-30 DIAGNOSIS — M170 Bilateral primary osteoarthritis of knee: Secondary | ICD-10-CM | POA: Diagnosis not present

## 2023-01-30 DIAGNOSIS — M7121 Synovial cyst of popliteal space [Baker], right knee: Secondary | ICD-10-CM | POA: Diagnosis not present

## 2023-02-15 ENCOUNTER — Other Ambulatory Visit: Payer: Self-pay | Admitting: Internal Medicine

## 2023-02-15 DIAGNOSIS — I10 Essential (primary) hypertension: Secondary | ICD-10-CM

## 2023-02-15 DIAGNOSIS — E782 Mixed hyperlipidemia: Secondary | ICD-10-CM

## 2023-02-15 NOTE — Telephone Encounter (Signed)
Future OV scheduled 05/23/23.  Requested Prescriptions  Pending Prescriptions Disp Refills   lisinopril (ZESTRIL) 40 MG tablet [Pharmacy Med Name: LISINOPRIL 40 MG TABLET] 90 tablet 1    Sig: TAKE 1 TABLET BY MOUTH EVERY DAY     Cardiovascular:  ACE Inhibitors Failed - 02/15/2023  2:05 AM      Failed - Cr in normal range and within 180 days    Creatinine, Ser  Date Value Ref Range Status  05/17/2022 0.82 0.57 - 1.00 mg/dL Final         Failed - K in normal range and within 180 days    Potassium  Date Value Ref Range Status  05/17/2022 4.4 3.5 - 5.2 mmol/L Final         Failed - Valid encounter within last 6 months    Recent Outpatient Visits           2 months ago Localized swelling of left lower leg   [place] [facility] at [facility], Nyoka Cowden, MD   6 months ago Benign essential HTN   [place] [facility] at [facility], Nyoka Cowden, MD   9 months ago Annual physical exam   [facility] at [facility], Nyoka Cowden, MD   1 year ago Viral URI   [place] [facility] at [facility], Nyoka Cowden, MD   1 year ago Annual physical exam   [facility] at [facility], Nyoka Cowden, MD       Future Appointments             In 3 months Judithann Graves Nyoka Cowden, MD [facility] at [facility]            Passed - Patient is not pregnant      Passed - Last BP in normal range    BP Readings from Last 1 Encounters:  12/06/22 132/86

## 2023-05-23 ENCOUNTER — Ambulatory Visit (INDEPENDENT_AMBULATORY_CARE_PROVIDER_SITE_OTHER): Payer: BC Managed Care – PPO | Admitting: Internal Medicine

## 2023-05-23 ENCOUNTER — Encounter: Payer: Self-pay | Admitting: Internal Medicine

## 2023-05-23 VITALS — BP 128/76 | HR 72 | Ht 65.0 in | Wt 184.0 lb

## 2023-05-23 DIAGNOSIS — L570 Actinic keratosis: Secondary | ICD-10-CM | POA: Diagnosis not present

## 2023-05-23 DIAGNOSIS — Z1231 Encounter for screening mammogram for malignant neoplasm of breast: Secondary | ICD-10-CM | POA: Diagnosis not present

## 2023-05-23 DIAGNOSIS — E782 Mixed hyperlipidemia: Secondary | ICD-10-CM

## 2023-05-23 DIAGNOSIS — D225 Melanocytic nevi of trunk: Secondary | ICD-10-CM | POA: Diagnosis not present

## 2023-05-23 DIAGNOSIS — Z85820 Personal history of malignant melanoma of skin: Secondary | ICD-10-CM | POA: Diagnosis not present

## 2023-05-23 DIAGNOSIS — D2230 Melanocytic nevi of unspecified part of face: Secondary | ICD-10-CM | POA: Diagnosis not present

## 2023-05-23 DIAGNOSIS — F17200 Nicotine dependence, unspecified, uncomplicated: Secondary | ICD-10-CM

## 2023-05-23 DIAGNOSIS — Z1211 Encounter for screening for malignant neoplasm of colon: Secondary | ICD-10-CM

## 2023-05-23 DIAGNOSIS — D224 Melanocytic nevi of scalp and neck: Secondary | ICD-10-CM | POA: Diagnosis not present

## 2023-05-23 DIAGNOSIS — Z0000 Encounter for general adult medical examination without abnormal findings: Secondary | ICD-10-CM

## 2023-05-23 DIAGNOSIS — I10 Essential (primary) hypertension: Secondary | ICD-10-CM

## 2023-05-23 DIAGNOSIS — Z23 Encounter for immunization: Secondary | ICD-10-CM

## 2023-05-23 NOTE — Progress Notes (Signed)
Date:  05/23/2023   Name:  Renee Graham   DOB:  1960/12/31   MRN:  [id number]   Chief Complaint: Annual Exam Renee Graham is a 61 y.o. female who presents today for her Complete Annual Exam. She feels well. She reports exercising - some. She reports she is sleeping well. Breast complaints - none.  Her daughter is expecting a child in December and she needs Tdap.  Mammogram: 10/2022 @ [facility] DEXA: none Colonoscopy: none FIT given but never submitted Pap:  05/2021 neg/neg  Health Maintenance Due  Topic Date Due   Pneumococcal Vaccine 41-40 Years old (1 of 2 - PCV) Never done   COLON CANCER SCREENING ANNUAL FOBT  Never done   Colonoscopy  Never done   Lung Cancer Screening  03/11/2021    Immunization History  Administered Date(s) Administered   Influenza, Seasonal, Injecte, Preservative Fre 05/23/2023   Influenza,inj,Quad PF,6+ Mos 05/17/2022   PFIZER Comirnaty(Gray Top)Covid-19 Tri-Sucrose Vaccine 03/04/2021   PFIZER(Purple Top)SARS-COV-2 Vaccination 10/16/2019, 11/04/2019   Pfizer(Comirnaty)Fall Seasonal Vaccine 12 years and older 08/05/2022, 05/02/2023   Tdap 05/23/2023   Zoster Recombinant(Shingrix) 01/20/2020, 04/06/2020     Hypertension This is a chronic problem. The problem is controlled. Pertinent negatives include no anxiety, chest pain, headaches, palpitations, peripheral edema or shortness of breath. Past treatments include ACE inhibitors. The current treatment provides significant improvement. There are no compliance problems.  There is no history of kidney disease, CAD/MI or CVA.  Hyperlipidemia This is a chronic problem. The problem is controlled. Pertinent negatives include no chest pain, focal weakness, myalgias or shortness of breath. Current antihyperlipidemic treatment includes statins.    Review of Systems  Constitutional:  Negative for fatigue and unexpected weight change.  HENT:  Negative for nosebleeds.   Eyes:  Negative for visual disturbance.   Respiratory:  Negative for cough, chest tightness, shortness of breath and wheezing.   Cardiovascular:  Negative for chest pain, palpitations and leg swelling.  Gastrointestinal:  Negative for abdominal pain, constipation and diarrhea.  Genitourinary:  Negative for dysuria, genital sores and vaginal discharge.  Musculoskeletal:  Negative for myalgias.  Neurological:  Negative for dizziness, focal weakness, weakness, light-headedness and headaches.  Psychiatric/Behavioral:  Negative for dysphoric mood and sleep disturbance. The patient is not nervous/anxious.      Lab Results  Component Value Date   NA 145 (H) 05/17/2022   K 4.4 05/17/2022   CO2 25 05/17/2022   GLUCOSE 80 05/17/2022   BUN 11 05/17/2022   CREATININE 0.82 05/17/2022   CALCIUM 10.2 05/17/2022   EGFR 81 05/17/2022   GFRNONAA 76 01/20/2020   Lab Results  Component Value Date   CHOL 183 05/17/2022   HDL 56 05/17/2022   LDLCALC 97 05/17/2022   TRIG 172 (H) 05/17/2022   CHOLHDL 3.3 05/17/2022   Lab Results  Component Value Date   TSH 1.320 05/17/2022   Lab Results  Component Value Date   HGBA1C 5.2 05/17/2022   Lab Results  Component Value Date   WBC 8.7 05/17/2022   HGB 14.7 05/17/2022   HCT 42.9 05/17/2022   MCV 95 05/17/2022   PLT 266 05/17/2022   Lab Results  Component Value Date   ALT 20 05/17/2022   AST 31 05/17/2022   ALKPHOS 96 05/17/2022   BILITOT 0.6 05/17/2022   No results found for: "25OHVITD2", "25OHVITD3", "VD25OH"   Patient Active Problem List   Diagnosis Date Noted   Hematuria, microscopic 02/29/2016   Benign essential HTN  05/14/2015   Tobacco use disorder 05/14/2015   H/O abnormal cervical Papanicolaou smear 05/14/2015   Migraine without aura and responsive to treatment 05/14/2015   Mixed hyperlipidemia 05/14/2015    No Known Allergies  Past Surgical History:  Procedure Laterality Date   TUBAL LIGATION      Social History   Tobacco Use   Smoking status: Every Day     Current packs/day: 1.00    Average packs/day: 1 pack/day for 25.0 years (25.0 ttl pk-yrs)    Types: Cigarettes   Smokeless tobacco: Current   Tobacco comments:    smoked earlier in life then quit and restarted 2008  Vaping Use   Vaping status: Never Used  Substance Use Topics   Alcohol use: Yes    Alcohol/week: 21.0 standard drinks of alcohol    Types: 21 Glasses of wine per week   Drug use: Never     Medication list has been reviewed and updated.  Current Meds  Medication Sig   atorvastatin (LIPITOR) 10 MG tablet TAKE 1 TABLET BY MOUTH EVERY DAY   ibuprofen (ADVIL,MOTRIN) 200 MG tablet Take 800 mg by mouth every 6 (six) hours as needed.   lisinopril (ZESTRIL) 40 MG tablet TAKE 1 TABLET BY MOUTH EVERY DAY       05/23/2023   10:12 AM 12/06/2022    1:44 PM 08/05/2022    1:34 PM 05/17/2022   10:13 AM  GAD 7 : Generalized Anxiety Score  Nervous, Anxious, on Edge 0 0 0 0  Control/stop worrying 0 0 0 1  Worry too much - different things 0 0 0 0  Trouble relaxing 1 0 0 0  Restless 0 1 0 0  Easily annoyed or irritable 0 0 0 0  Afraid - awful might happen 0 0 0 0  Total GAD 7 Score 1 1 0 1  Anxiety Difficulty Not difficult at all Not difficult at all Not difficult at all Not difficult at all       05/23/2023   10:12 AM 12/06/2022    1:44 PM 08/05/2022    1:34 PM  Depression screen PHQ 2/9  Decreased Interest 0 0 0  Down, Depressed, Hopeless 0 0 0  PHQ - 2 Score 0 0 0  Altered sleeping 1 1 0  Tired, decreased energy 1 3 1   Change in appetite 0 0 0  Feeling bad or failure about yourself  0 0 0  Trouble concentrating 0 0 0  Moving slowly or fidgety/restless 0 0 0  Suicidal thoughts 0 0 0  PHQ-9 Score 2 4 1   Difficult doing work/chores Not difficult at all Not difficult at all Not difficult at all    BP Readings from Last 3 Encounters:  05/23/23 128/76  12/06/22 132/86  08/05/22 124/84    Physical Exam Vitals and nursing note reviewed.  Constitutional:       General: She is not in acute distress.    Appearance: She is well-developed.  HENT:     Head: Normocephalic and atraumatic.     Right Ear: Tympanic membrane and ear canal normal.     Left Ear: Tympanic membrane and ear canal normal.     Nose:     Right Sinus: No maxillary sinus tenderness.     Left Sinus: No maxillary sinus tenderness.  Eyes:     General: No scleral icterus.       Right eye: No discharge.        Left eye: No discharge.  Conjunctiva/sclera: Conjunctivae normal.  Neck:     Thyroid: No thyromegaly.     Vascular: No carotid bruit.  Cardiovascular:     Rate and Rhythm: Normal rate and regular rhythm.     Pulses: Normal pulses.     Heart sounds: Normal heart sounds.  Pulmonary:     Effort: Pulmonary effort is normal. No respiratory distress.     Breath sounds: No wheezing.  Chest:  Breasts:    Right: No mass, nipple discharge, skin change or tenderness.     Left: No mass, nipple discharge, skin change or tenderness.  Abdominal:     General: Bowel sounds are normal.     Palpations: Abdomen is soft.     Tenderness: There is no abdominal tenderness.  Musculoskeletal:     Cervical back: Normal range of motion. No erythema.     Left knee: Effusion present.     Right lower leg: No edema.     Left lower leg: No swelling. No edema.  Lymphadenopathy:     Cervical: No cervical adenopathy.  Skin:    General: Skin is warm and dry.     Findings: No rash.  Neurological:     General: No focal deficit present.     Mental Status: She is alert and oriented to person, place, and time.     Cranial Nerves: No cranial nerve deficit.     Sensory: No sensory deficit.     Deep Tendon Reflexes: Reflexes are normal and symmetric.  Psychiatric:        Attention and Perception: Attention normal.        Mood and Affect: Mood normal.     Wt Readings from Last 3 Encounters:  05/23/23 184 lb (83.5 kg)  12/06/22 191 lb (86.6 kg)  08/05/22 185 lb (83.9 kg)    BP 128/76 (BP  Location: Left Arm, Cuff Size: Large)   Pulse 72   Ht 5\' 5"  (1.651 m)   Wt 184 lb (83.5 kg)   LMP 12/07/2015   SpO2 98%   BMI 30.62 kg/m   Assessment and Plan:  Problem List Items Addressed This Visit       Unprioritized   Benign essential HTN (Chronic)    Normal exam with stable BP on lisinopril. No concerns or side effects to current medication. No change in regimen; continue low sodium diet.       Relevant Orders   CBC with Differential/Platelet   Comprehensive metabolic panel   TSH   Urinalysis, Routine w reflex microscopic   Mixed hyperlipidemia    LDL is  Lab Results  Component Value Date   LDLCALC 97 05/17/2022   Currently being treated with atorvastatin with good compliance and no concerns.       Relevant Orders   Lipid panel   Tobacco use disorder (Chronic)    She continues to smoke daily. Recommend LDCT screening annually      Relevant Orders   Ambulatory Referral Lung Cancer Screening  Pulmonary   Other Visit Diagnoses     Annual physical exam    -  Primary   Relevant Orders   CBC with Differential/Platelet   Comprehensive metabolic panel   Lipid panel   TSH   Urinalysis, Routine w reflex microscopic   Encounter for screening mammogram for breast cancer       schedule at [facility]   Colon cancer screening       Relevant Orders   Cologuard   Need for Tdap vaccination  Relevant Orders   Tdap vaccine greater than or equal to 7yo IM (Completed)   Need for influenza vaccination       Relevant Orders   Flu vaccine trivalent PF, 6mos and older(Flulaval,Afluria,Fluarix,Fluzone) (Completed)       Return in about 6 months (around 11/21/2023) for HTN.    Reubin Milan, MD [facility] [place]

## 2023-05-23 NOTE — Patient Instructions (Signed)
Call [facility] to schedule your Mammogram at [phone].

## 2023-05-23 NOTE — Assessment & Plan Note (Signed)
She continues to smoke daily. Recommend LDCT screening annually

## 2023-05-23 NOTE — Assessment & Plan Note (Signed)
LDL is  Lab Results  Component Value Date   LDLCALC 97 05/17/2022   Currently being treated with atorvastatin with good compliance and no concerns.

## 2023-05-23 NOTE — Assessment & Plan Note (Signed)
Normal exam with stable BP on lisinopril. No concerns or side effects to current medication. No change in regimen; continue low sodium diet.

## 2023-05-24 LAB — CBC WITH DIFFERENTIAL/PLATELET
Basophils Absolute: 0.1 10*3/uL (ref 0.0–0.2)
Basos: 1 %
EOS (ABSOLUTE): 0.2 10*3/uL (ref 0.0–0.4)
Eos: 2 %
Hematocrit: 46 % (ref 34.0–46.6)
Hemoglobin: 15.1 g/dL (ref 11.1–15.9)
Immature Grans (Abs): 0 10*3/uL (ref 0.0–0.1)
Immature Granulocytes: 0 %
Lymphocytes Absolute: 2.6 10*3/uL (ref 0.7–3.1)
Lymphs: 32 %
MCH: 31.8 pg (ref 26.6–33.0)
MCHC: 32.8 g/dL (ref 31.5–35.7)
MCV: 97 fL (ref 79–97)
Monocytes Absolute: 0.7 10*3/uL (ref 0.1–0.9)
Monocytes: 8 %
Neutrophils Absolute: 4.5 10*3/uL (ref 1.4–7.0)
Neutrophils: 57 %
Platelets: 298 10*3/uL (ref 150–450)
RBC: 4.75 x10E6/uL (ref 3.77–5.28)
RDW: 12.1 % (ref 11.7–15.4)
WBC: 8.1 10*3/uL (ref 3.4–10.8)

## 2023-05-24 LAB — URINALYSIS, ROUTINE W REFLEX MICROSCOPIC
Bilirubin, UA: NEGATIVE
Glucose, UA: NEGATIVE
Ketones, UA: NEGATIVE
Leukocytes,UA: NEGATIVE
Nitrite, UA: NEGATIVE
Protein,UA: NEGATIVE
Specific Gravity, UA: 1.016 (ref 1.005–1.030)
Urobilinogen, Ur: 0.2 mg/dL (ref 0.2–1.0)
pH, UA: 7 (ref 5.0–7.5)

## 2023-05-24 LAB — MICROSCOPIC EXAMINATION
Bacteria, UA: NONE SEEN
Casts: NONE SEEN /[LPF]
Epithelial Cells (non renal): NONE SEEN /[HPF] (ref 0–10)
WBC, UA: NONE SEEN /[HPF] (ref 0–5)

## 2023-05-24 LAB — COMPREHENSIVE METABOLIC PANEL
ALT: 23 [IU]/L (ref 0–32)
AST: 34 [IU]/L (ref 0–40)
Albumin: 4.4 g/dL (ref 3.9–4.9)
Alkaline Phosphatase: 97 [IU]/L (ref 44–121)
BUN/Creatinine Ratio: 11 — ABNORMAL LOW (ref 12–28)
BUN: 9 mg/dL (ref 8–27)
Bilirubin Total: 0.5 mg/dL (ref 0.0–1.2)
CO2: 25 mmol/L (ref 20–29)
Calcium: 9.8 mg/dL (ref 8.7–10.3)
Chloride: 104 mmol/L (ref 96–106)
Creatinine, Ser: 0.85 mg/dL (ref 0.57–1.00)
Globulin, Total: 2.5 g/dL (ref 1.5–4.5)
Glucose: 86 mg/dL (ref 70–99)
Potassium: 4.6 mmol/L (ref 3.5–5.2)
Sodium: 142 mmol/L (ref 134–144)
Total Protein: 6.9 g/dL (ref 6.0–8.5)
eGFR: 77 mL/min/{1.73_m2} (ref >59)

## 2023-05-24 LAB — LIPID PANEL
Chol/HDL Ratio: 4.3 {ratio} (ref 0.0–4.4)
Cholesterol, Total: 193 mg/dL (ref 100–199)
HDL: 45 mg/dL (ref >39)
LDL Chol Calc (NIH): 114 mg/dL — ABNORMAL HIGH (ref 0–99)
Triglycerides: 192 mg/dL — ABNORMAL HIGH (ref 0–149)
VLDL Cholesterol Cal: 34 mg/dL (ref 5–40)

## 2023-05-24 LAB — TSH: TSH: 1.6 u[IU]/mL (ref 0.450–4.500)

## 2023-06-23 ENCOUNTER — Other Ambulatory Visit: Payer: Self-pay

## 2023-06-23 DIAGNOSIS — Z87891 Personal history of nicotine dependence: Secondary | ICD-10-CM

## 2023-06-23 DIAGNOSIS — Z122 Encounter for screening for malignant neoplasm of respiratory organs: Secondary | ICD-10-CM

## 2023-06-23 DIAGNOSIS — F17210 Nicotine dependence, cigarettes, uncomplicated: Secondary | ICD-10-CM

## 2023-07-19 ENCOUNTER — Ambulatory Visit: Payer: BC Managed Care – PPO

## 2023-09-17 ENCOUNTER — Other Ambulatory Visit: Payer: Self-pay | Admitting: Internal Medicine

## 2023-09-17 DIAGNOSIS — I10 Essential (primary) hypertension: Secondary | ICD-10-CM

## 2023-09-17 DIAGNOSIS — E782 Mixed hyperlipidemia: Secondary | ICD-10-CM

## 2023-09-18 NOTE — Telephone Encounter (Signed)
 Requested Prescriptions  Pending Prescriptions Disp Refills   lisinopril  (ZESTRIL ) 40 MG tablet [Pharmacy Med Name: LISINOPRIL  40 MG TABLET] 90 tablet 0    Sig: TAKE 1 TABLET BY MOUTH EVERY DAY     Cardiovascular:  ACE Inhibitors Passed - 09/18/2023  3:04 PM      Passed - Cr in normal range and within 180 days    Creatinine, Ser  Date Value Ref Range Status  05/23/2023 0.85 0.57 - 1.00 mg/dL Final         Passed - K in normal range and within 180 days    Potassium  Date Value Ref Range Status  05/23/2023 4.6 3.5 - 5.2 mmol/L Final         Passed - Patient is not pregnant      Passed - Last BP in normal range    BP Readings from Last 1 Encounters:  05/23/23 128/76         Passed - Valid encounter within last 6 months    Recent Outpatient Visits           3 months ago Annual physical exam   [place] [facility] at [facility], [place], MD   9 months ago Localized swelling of left lower leg   [place] [facility] at [facility], [place], MD   1 year ago Benign essential HTN   [place] [facility] at [facility], [place], MD   1 year ago Annual physical exam   [facility] at [facility], [place], MD   2 years ago Viral URI   [place] [facility] at [facility], [place], MD               atorvastatin  (LIPITOR) 10 MG tablet [Pharmacy Med Name: ATORVASTATIN  10 MG TABLET] 90 tablet 1    Sig: TAKE 1 TABLET BY MOUTH EVERY DAY     Cardiovascular:  Antilipid - Statins Failed - 09/18/2023  3:04 PM      Failed - Lipid Panel in normal range within the last 12 months    Cholesterol, Total  Date Value Ref Range Status  05/23/2023 193 100 - 199 mg/dL Final   LDL Chol Calc (NIH)  Date Value Ref Range Status  05/23/2023 114 (H) 0 - 99 mg/dL Final   HDL  Date Value Ref Range Status   05/23/2023 45 >39 mg/dL Final   Triglycerides  Date Value Ref Range Status  05/23/2023 192 (H) 0 - 149 mg/dL Final         Passed - Patient is not pregnant      Passed - Valid encounter within last 12 months    Recent Outpatient Visits           3 months ago Annual physical exam   [place] [facility] at [facility], [place], MD   9 months ago Localized swelling of left lower leg   [facility] at [facility], [place], MD   1 year ago Benign essential HTN   [place] [facility] at [facility], [place], MD   1 year ago Annual physical exam   [facility] at [facility], [place], MD   2 years ago  Viral URI   [facility] at [facility], [place]ales [place], MD

## 2023-12-24 ENCOUNTER — Other Ambulatory Visit: Payer: Self-pay | Admitting: Internal Medicine

## 2023-12-24 DIAGNOSIS — I10 Essential (primary) hypertension: Secondary | ICD-10-CM

## 2023-12-26 NOTE — Telephone Encounter (Signed)
 Requested Prescriptions  Pending Prescriptions Disp Refills   lisinopril  (ZESTRIL ) 40 MG tablet [Pharmacy Med Name: LISINOPRIL  40 MG TABLET] 30 tablet 0    Sig: TAKE 1 TABLET BY MOUTH EVERY DAY     Cardiovascular:  ACE Inhibitors Failed - 12/26/2023  2:30 PM      Failed - Cr in normal range and within 180 days    Creatinine, Ser  Date Value Ref Range Status  05/23/2023 0.85 0.57 - 1.00 mg/dL Final         Failed - K in normal range and within 180 days    Potassium  Date Value Ref Range Status  05/23/2023 4.6 3.5 - 5.2 mmol/L Final         Failed - Valid encounter within last 6 months    Recent Outpatient Visits   None            Passed - Patient is not pregnant      Passed - Last BP in normal range    BP Readings from Last 1 Encounters:  05/23/23 128/76

## 2023-12-26 NOTE — Telephone Encounter (Signed)
 Called pt and LM on VM to call back to schedule appt- 30 day courtesy refill provided.

## 2024-01-03 ENCOUNTER — Ambulatory Visit: Admitting: Internal Medicine

## 2024-01-03 ENCOUNTER — Encounter: Payer: Self-pay | Admitting: Internal Medicine

## 2024-01-03 VITALS — BP 136/86 | HR 72 | Ht 65.0 in | Wt 196.2 lb

## 2024-01-03 DIAGNOSIS — Z23 Encounter for immunization: Secondary | ICD-10-CM | POA: Diagnosis not present

## 2024-01-03 DIAGNOSIS — F17200 Nicotine dependence, unspecified, uncomplicated: Secondary | ICD-10-CM

## 2024-01-03 DIAGNOSIS — I10 Essential (primary) hypertension: Secondary | ICD-10-CM | POA: Diagnosis not present

## 2024-01-03 MED ORDER — LISINOPRIL 40 MG PO TABS
40.0000 mg | ORAL_TABLET | Freq: Every day | ORAL | 1 refills | Status: DC
Start: 2024-01-03 — End: 2024-06-17

## 2024-01-03 NOTE — Patient Instructions (Signed)
 Your annual lung cancer screening low dose CT scan is due.  Please call us  at your earliest convenience to schedule this appointment.    We can be reached at [phone].

## 2024-01-03 NOTE — Assessment & Plan Note (Addendum)
 Blood pressure is well controlled.  Current medications are lisinopril  40 mg. Will continue same regimen along with efforts to limit dietary sodium and to limit carbohydrates to avoid further weight gain. Renal function normal.

## 2024-01-03 NOTE — Progress Notes (Signed)
 Date:  01/03/2024   Name:  Renee Graham   DOB:  August 31, 1960   MRN:  [id number]   Chief Complaint: Hypertension  Hypertension This is a chronic problem. The problem is controlled. Pertinent negatives include no chest pain, headaches, palpitations or shortness of breath. Past treatments include ACE inhibitors. The current treatment provides significant improvement. There are no compliance problems.     Review of Systems  Constitutional:  Negative for chills, fatigue, fever and unexpected weight change.  HENT:  Negative for trouble swallowing.   Eyes:  Negative for visual disturbance.  Respiratory:  Negative for cough, chest tightness, shortness of breath and wheezing.   Cardiovascular:  Negative for chest pain, palpitations and leg swelling.  Gastrointestinal:  Negative for abdominal pain, constipation and diarrhea.  Musculoskeletal:  Negative for arthralgias and myalgias.  Neurological:  Negative for dizziness, weakness, light-headedness and headaches.  Psychiatric/Behavioral:  Negative for dysphoric mood and sleep disturbance. The patient is not nervous/anxious.      Lab Results  Component Value Date   NA 142 05/23/2023   K 4.6 05/23/2023   CO2 25 05/23/2023   GLUCOSE 86 05/23/2023   BUN 9 05/23/2023   CREATININE 0.85 05/23/2023   CALCIUM  9.8 05/23/2023   EGFR 77 05/23/2023   GFRNONAA 76 01/20/2020   Lab Results  Component Value Date   CHOL 193 05/23/2023   HDL 45 05/23/2023   LDLCALC 114 (H) 05/23/2023   TRIG 192 (H) 05/23/2023   CHOLHDL 4.3 05/23/2023   Lab Results  Component Value Date   TSH 1.600 05/23/2023   Lab Results  Component Value Date   HGBA1C 5.2 05/17/2022   Lab Results  Component Value Date   WBC 8.1 05/23/2023   HGB 15.1 05/23/2023   HCT 46.0 05/23/2023   MCV 97 05/23/2023   PLT 298 05/23/2023   Lab Results  Component Value Date   ALT 23 05/23/2023   AST 34 05/23/2023   ALKPHOS 97 05/23/2023   BILITOT 0.5 05/23/2023   No results  found for: "25OHVITD2", "25OHVITD3", "VD25OH"   Patient Active Problem List   Diagnosis Date Noted   Hematuria, microscopic 02/29/2016   Benign essential HTN 05/14/2015   Tobacco use disorder 05/14/2015   H/O abnormal cervical Papanicolaou smear 05/14/2015   Migraine without aura and responsive to treatment 05/14/2015   Mixed hyperlipidemia 05/14/2015    No Known Allergies  Past Surgical History:  Procedure Laterality Date   TUBAL LIGATION      Social History   Tobacco Use   Smoking status: Every Day    Current packs/day: 1.00    Average packs/day: 1 pack/day for 25.0 years (25.0 ttl pk-yrs)    Types: Cigarettes   Smokeless tobacco: Current   Tobacco comments:    smoked earlier in life then quit and restarted 2008  Vaping Use   Vaping status: Never Used  Substance Use Topics   Alcohol use: Yes    Alcohol/week: 21.0 standard drinks of alcohol    Types: 21 Glasses of wine per week   Drug use: Never     Medication list has been reviewed and updated.  Current Meds  Medication Sig   atorvastatin  (LIPITOR) 10 MG tablet TAKE 1 TABLET BY MOUTH EVERY DAY   ibuprofen (ADVIL,MOTRIN) 200 MG tablet Take 800 mg by mouth every 6 (six) hours as needed.   [DISCONTINUED] lisinopril  (ZESTRIL ) 40 MG tablet TAKE 1 TABLET BY MOUTH EVERY DAY       01/03/2024  1:32 PM 05/23/2023   10:12 AM 12/06/2022    1:44 PM 08/05/2022    1:34 PM  GAD 7 : Generalized Anxiety Score  Nervous, Anxious, on Edge 0 0 0 0  Control/stop worrying  0 0 0  Worry too much - different things 0 0 0 0  Trouble relaxing 1 1 0 0  Restless 0 0 1 0  Easily annoyed or irritable 0 0 0 0  Afraid - awful might happen 0 0 0 0  Total GAD 7 Score  1 1 0  Anxiety Difficulty Not difficult at all Not difficult at all Not difficult at all Not difficult at all       01/03/2024    1:32 PM 05/23/2023   10:12 AM 12/06/2022    1:44 PM  Depression screen PHQ 2/9  Decreased Interest 0 0 0  Down, Depressed, Hopeless 0 0  0  PHQ - 2 Score 0 0 0  Altered sleeping 1 1 1   Tired, decreased energy 1 1 3   Change in appetite 0 0 0  Feeling bad or failure about yourself  0 0 0  Trouble concentrating 0 0 0  Moving slowly or fidgety/restless 0 0 0  Suicidal thoughts 0 0 0  PHQ-9 Score 2 2 4   Difficult doing work/chores Not difficult at all Not difficult at all Not difficult at all    BP Readings from Last 3 Encounters:  01/03/24 136/86  05/23/23 128/76  12/06/22 132/86    Physical Exam Vitals and nursing note reviewed.  Constitutional:      General: She is not in acute distress.    Appearance: Normal appearance. She is well-developed.  HENT:     Head: Normocephalic and atraumatic.  Cardiovascular:     Rate and Rhythm: Normal rate and regular rhythm.  Pulmonary:     Effort: Pulmonary effort is normal. No respiratory distress.     Breath sounds: No wheezing or rhonchi.  Musculoskeletal:     Cervical back: Normal range of motion.     Right lower leg: No edema.     Left lower leg: No edema.  Skin:    General: Skin is warm and dry.     Findings: No rash.  Neurological:     Mental Status: She is alert and oriented to person, place, and time.  Psychiatric:        Mood and Affect: Mood normal.        Behavior: Behavior normal.     Wt Readings from Last 3 Encounters:  01/03/24 196 lb 4 oz (89 kg)  05/23/23 184 lb (83.5 kg)  12/06/22 191 lb (86.6 kg)    BP 136/86   Pulse 72   Ht 5\' 5"  (1.651 m)   Wt 196 lb 4 oz (89 kg)   LMP 12/07/2015   SpO2 96%   BMI 32.66 kg/m   Assessment and Plan:  Problem List Items Addressed This Visit       Unprioritized   Benign essential HTN - Primary (Chronic)   Blood pressure is well controlled.  Current medications are lisinopril  40 mg. Will continue same regimen along with efforts to limit dietary sodium and to limit carbohydrates to avoid further weight gain. Renal function normal.       Relevant Medications   lisinopril  (ZESTRIL ) 40 MG tablet    Tobacco use disorder (Chronic)   She is not interested in quitting at this time. LDCT ordered but never scheduled by patient - number provided  for her to call.       Other Visit Diagnoses       Immunization due       Relevant Orders   Pneumococcal conjugate vaccine 20-valent (Completed)       Return in about 5 months (around 06/04/2024) for CPX.    Sheron Dixons, MD [facility] [place]

## 2024-01-03 NOTE — Assessment & Plan Note (Addendum)
 She is not interested in quitting at this time. LDCT ordered but never scheduled by patient - number provided for her to call.

## 2024-01-15 DIAGNOSIS — Z1231 Encounter for screening mammogram for malignant neoplasm of breast: Secondary | ICD-10-CM | POA: Diagnosis not present

## 2024-01-15 LAB — HM MAMMOGRAPHY

## 2024-03-13 DIAGNOSIS — D485 Neoplasm of uncertain behavior of skin: Secondary | ICD-10-CM | POA: Diagnosis not present

## 2024-03-13 DIAGNOSIS — L570 Actinic keratosis: Secondary | ICD-10-CM | POA: Diagnosis not present

## 2024-03-13 DIAGNOSIS — D2230 Melanocytic nevi of unspecified part of face: Secondary | ICD-10-CM | POA: Diagnosis not present

## 2024-03-13 DIAGNOSIS — D224 Melanocytic nevi of scalp and neck: Secondary | ICD-10-CM | POA: Diagnosis not present

## 2024-03-13 DIAGNOSIS — D225 Melanocytic nevi of trunk: Secondary | ICD-10-CM | POA: Diagnosis not present

## 2024-03-13 DIAGNOSIS — D2260 Melanocytic nevi of unspecified upper limb, including shoulder: Secondary | ICD-10-CM | POA: Diagnosis not present

## 2024-04-17 DIAGNOSIS — M23204 Derangement of unspecified medial meniscus due to old tear or injury, left knee: Secondary | ICD-10-CM | POA: Diagnosis not present

## 2024-04-17 DIAGNOSIS — M170 Bilateral primary osteoarthritis of knee: Secondary | ICD-10-CM | POA: Diagnosis not present

## 2024-04-17 DIAGNOSIS — M7121 Synovial cyst of popliteal space [Baker], right knee: Secondary | ICD-10-CM | POA: Diagnosis not present

## 2024-05-07 DIAGNOSIS — L738 Other specified follicular disorders: Secondary | ICD-10-CM | POA: Diagnosis not present

## 2024-05-07 DIAGNOSIS — L723 Sebaceous cyst: Secondary | ICD-10-CM | POA: Diagnosis not present

## 2024-06-05 DIAGNOSIS — M170 Bilateral primary osteoarthritis of knee: Secondary | ICD-10-CM | POA: Diagnosis not present

## 2024-06-17 ENCOUNTER — Ambulatory Visit: Admitting: Internal Medicine

## 2024-06-17 ENCOUNTER — Encounter: Payer: Self-pay | Admitting: Internal Medicine

## 2024-06-17 ENCOUNTER — Encounter: Payer: Self-pay | Admitting: *Deleted

## 2024-06-17 VITALS — BP 132/86 | HR 77 | Ht 65.0 in | Wt 199.0 lb

## 2024-06-17 DIAGNOSIS — Z0000 Encounter for general adult medical examination without abnormal findings: Secondary | ICD-10-CM | POA: Diagnosis not present

## 2024-06-17 DIAGNOSIS — I10 Essential (primary) hypertension: Secondary | ICD-10-CM | POA: Diagnosis not present

## 2024-06-17 DIAGNOSIS — F17200 Nicotine dependence, unspecified, uncomplicated: Secondary | ICD-10-CM

## 2024-06-17 DIAGNOSIS — Z23 Encounter for immunization: Secondary | ICD-10-CM | POA: Diagnosis not present

## 2024-06-17 DIAGNOSIS — Z131 Encounter for screening for diabetes mellitus: Secondary | ICD-10-CM

## 2024-06-17 DIAGNOSIS — E782 Mixed hyperlipidemia: Secondary | ICD-10-CM

## 2024-06-17 DIAGNOSIS — Z1211 Encounter for screening for malignant neoplasm of colon: Secondary | ICD-10-CM

## 2024-06-17 MED ORDER — ATORVASTATIN CALCIUM 10 MG PO TABS: 10.0000 mg | ORAL_TABLET | Freq: Every day | ORAL | 1 refills | Status: AC

## 2024-06-17 MED ORDER — LISINOPRIL 40 MG PO TABS: 40.0000 mg | ORAL_TABLET | Freq: Every day | ORAL | 1 refills | Status: AC

## 2024-06-17 NOTE — Progress Notes (Signed)
 Date:  06/17/2024   Name:  Renee Graham   DOB:  01-22-1961   MRN:  [id number]   Chief Complaint: Annual Exam Renee Graham is a 63 y.o. female who presents today for her Complete Annual Exam. She feels well. She reports exercising none. She reports she is sleeping well. Breast complaints none.  Health Maintenance  Topic Date Due   Stool Blood Test  Never done   Colon Cancer Screening  Never done   Screening for Lung Cancer  03/11/2021   COVID-19 Vaccine (6 - 2025-26 season) 04/08/2024   Breast Cancer Screening  01/14/2025   Pap with HPV screening  05/14/2026   DTaP/Tdap/Td vaccine (2 - Td or Tdap) 05/22/2033   Pneumococcal Vaccine for age over 65  Completed   Flu Shot  Completed   Hepatitis C Screening  Completed   HIV Screening  Completed   Zoster (Shingles) Vaccine  Completed   Hepatitis B Vaccine  Aged Out   HPV Vaccine  Aged Out   Meningitis B Vaccine  Aged Out    Hypertension This is a chronic problem. The problem is controlled. Pertinent negatives include no chest pain, headaches, palpitations or shortness of breath. Past treatments include ACE inhibitors. The current treatment provides significant improvement. There is no history of kidney disease, CAD/MI or CVA.  Hyperlipidemia This is a chronic problem. The problem is controlled. Pertinent negatives include no chest pain, myalgias or shortness of breath. Current antihyperlipidemic treatment includes statins. The current treatment provides moderate improvement of lipids.    Review of Systems  Constitutional:  Negative for fatigue and unexpected weight change.  HENT:  Negative for trouble swallowing.   Eyes:  Negative for visual disturbance.  Respiratory:  Negative for cough, chest tightness, shortness of breath and wheezing.   Cardiovascular:  Negative for chest pain, palpitations and leg swelling.  Gastrointestinal:  Negative for abdominal pain, constipation and diarrhea.  Genitourinary:  Negative for dysuria,  genital sores, hematuria and urgency.  Musculoskeletal:  Positive for arthralgias (knee OA - just got gel injections last week). Negative for myalgias.  Skin:  Negative for color change and rash.  Neurological:  Negative for dizziness, weakness, light-headedness and headaches.  Psychiatric/Behavioral:  Negative for dysphoric mood and sleep disturbance. The patient is not nervous/anxious.      Lab Results  Component Value Date   NA 142 05/23/2023   K 4.6 05/23/2023   CO2 25 05/23/2023   GLUCOSE 86 05/23/2023   BUN 9 05/23/2023   CREATININE 0.85 05/23/2023   CALCIUM  9.8 05/23/2023   EGFR 77 05/23/2023   GFRNONAA 76 01/20/2020   Lab Results  Component Value Date   CHOL 193 05/23/2023   HDL 45 05/23/2023   LDLCALC 114 (H) 05/23/2023   TRIG 192 (H) 05/23/2023   CHOLHDL 4.3 05/23/2023   Lab Results  Component Value Date   TSH 1.600 05/23/2023   Lab Results  Component Value Date   HGBA1C 5.2 05/17/2022   Lab Results  Component Value Date   WBC 8.1 05/23/2023   HGB 15.1 05/23/2023   HCT 46.0 05/23/2023   MCV 97 05/23/2023   PLT 298 05/23/2023   Lab Results  Component Value Date   ALT 23 05/23/2023   AST 34 05/23/2023   ALKPHOS 97 05/23/2023   BILITOT 0.5 05/23/2023   No results found for: MARIEN BOLLS, VD25OH   Patient Active Problem List   Diagnosis Date Noted   Hematuria, microscopic 02/29/2016  Benign essential HTN 05/14/2015   Tobacco use disorder 05/14/2015   H/O abnormal cervical Papanicolaou smear 05/14/2015   Migraine without aura and responsive to treatment 05/14/2015   Mixed hyperlipidemia 05/14/2015    No Known Allergies  Past Surgical History:  Procedure Laterality Date   TUBAL LIGATION      Social History   Tobacco Use   Smoking status: Every Day    Current packs/day: 1.00    Average packs/day: 1 pack/day for 25.0 years (25.0 ttl pk-yrs)    Types: Cigarettes   Smokeless tobacco: Current   Tobacco comments:    smoked  earlier in life then quit and restarted 2008  Vaping Use   Vaping status: Never Used  Substance Use Topics   Alcohol use: Yes    Alcohol/week: 21.0 standard drinks of alcohol    Types: 21 Glasses of wine per week   Drug use: Never     Medication list has been reviewed and updated.  Current Meds  Medication Sig   ibuprofen (ADVIL,MOTRIN) 200 MG tablet Take 800 mg by mouth every 6 (six) hours as needed.   [DISCONTINUED] atorvastatin  (LIPITOR) 10 MG tablet TAKE 1 TABLET BY MOUTH EVERY DAY   [DISCONTINUED] lisinopril  (ZESTRIL ) 40 MG tablet Take 1 tablet (40 mg total) by mouth daily.       06/17/2024    1:12 PM 01/03/2024    1:32 PM 05/23/2023   10:12 AM 12/06/2022    1:44 PM  GAD 7 : Generalized Anxiety Score  Nervous, Anxious, on Edge 0 0 0 0  Control/stop worrying 0  0 0  Worry too much - different things 0 0 0 0  Trouble relaxing 0 1 1 0  Restless 0 0 0 1  Easily annoyed or irritable 0 0 0 0  Afraid - awful might happen 0 0 0 0  Total GAD 7 Score 0  1 1  Anxiety Difficulty Not difficult at all Not difficult at all Not difficult at all Not difficult at all       06/17/2024    1:11 PM 01/03/2024    1:32 PM 05/23/2023   10:12 AM  Depression screen PHQ 2/9  Decreased Interest 0 0 0  Down, Depressed, Hopeless 0 0 0  PHQ - 2 Score 0 0 0  Altered sleeping 0 1 1  Tired, decreased energy 0 1 1  Change in appetite 0 0 0  Feeling bad or failure about yourself  0 0 0  Trouble concentrating 0 0 0  Moving slowly or fidgety/restless 0 0 0  Suicidal thoughts 0 0 0  PHQ-9 Score 0 2  2   Difficult doing work/chores Not difficult at all Not difficult at all Not difficult at all     Data saved with a previous flowsheet row definition    BP Readings from Last 3 Encounters:  06/17/24 132/86  01/03/24 136/86  05/23/23 128/76    Physical Exam Vitals and nursing note reviewed.  Constitutional:      General: She is not in acute distress.    Appearance: She is well-developed.   HENT:     Head: Normocephalic and atraumatic.     Right Ear: Tympanic membrane and ear canal normal.     Left Ear: Tympanic membrane and ear canal normal.     Nose:     Right Sinus: No maxillary sinus tenderness.     Left Sinus: No maxillary sinus tenderness.  Eyes:     General: No scleral  icterus.       Right eye: No discharge.        Left eye: No discharge.     Conjunctiva/sclera: Conjunctivae normal.  Neck:     Thyroid: No thyromegaly.     Vascular: No carotid bruit.  Cardiovascular:     Rate and Rhythm: Normal rate and regular rhythm.     Pulses: Normal pulses.     Heart sounds: Normal heart sounds.  Pulmonary:     Effort: Pulmonary effort is normal. No respiratory distress.     Breath sounds: No wheezing.  Abdominal:     General: Bowel sounds are normal.     Palpations: Abdomen is soft.     Tenderness: There is no abdominal tenderness.  Musculoskeletal:     Cervical back: Normal range of motion. No erythema.     Right lower leg: No edema.     Left lower leg: No edema.  Lymphadenopathy:     Cervical: No cervical adenopathy.  Skin:    General: Skin is warm and dry.     Capillary Refill: Capillary refill takes less than 2 seconds.     Findings: No rash.  Neurological:     General: No focal deficit present.     Mental Status: She is alert and oriented to person, place, and time.     Cranial Nerves: No cranial nerve deficit.     Sensory: No sensory deficit.     Deep Tendon Reflexes: Reflexes are normal and symmetric.  Psychiatric:        Attention and Perception: Attention normal.        Mood and Affect: Mood normal.     Wt Readings from Last 3 Encounters:  06/17/24 199 lb (90.3 kg)  01/03/24 196 lb 4 oz (89 kg)  05/23/23 184 lb (83.5 kg)    BP 132/86   Pulse 77   Ht 5' 5 (1.651 m)   Wt 199 lb (90.3 kg)   LMP 12/07/2015   SpO2 97%   BMI 33.12 kg/m   Assessment and Plan:  Problem List Items Addressed This Visit       Unprioritized   Benign  essential HTN (Chronic)   Well controlled blood pressure today. Current regimen is lisinopril  40 mg. No medication side effects noted.        Relevant Medications   lisinopril  (ZESTRIL ) 40 MG tablet   atorvastatin  (LIPITOR) 10 MG tablet   Other Relevant Orders   CBC with Differential/Platelet   Comprehensive metabolic panel with GFR   TSH   Microalbumin / creatinine urine ratio   Tobacco use disorder (Chronic)   Qualifies for LDCT screening      Relevant Orders   Ambulatory Referral Lung Cancer Screening [facility]   Mixed hyperlipidemia   LDL is  Lab Results  Component Value Date   LDLCALC 114 (H) 05/23/2023    Current medication regimen is atorvastatin  10 mg. Goal LDL is < 100.       Relevant Medications   lisinopril  (ZESTRIL ) 40 MG tablet   atorvastatin  (LIPITOR) 10 MG tablet   Other Relevant Orders   Lipid panel   Other Visit Diagnoses       Annual physical exam    -  Primary   screenings and immunizations are up to date she is overdue for CRC screen Pap due in 2027   Relevant Orders   CBC with Differential/Platelet   Comprehensive metabolic panel with GFR   Hemoglobin A1c   Lipid  panel   TSH     Colon cancer screening       Relevant Orders   Cologuard     Screening for diabetes mellitus       Relevant Orders   Hemoglobin A1c     Encounter for immunization       Relevant Orders   Flu vaccine trivalent PF, 6mos and older(Flulaval,Afluria,Fluarix,Fluzone) (Completed)      She plans to find a PCP closer to home in [place]. No follow-ups on file.    Leita HILARIO Adie, MD [facility] [place]

## 2024-06-17 NOTE — Assessment & Plan Note (Signed)
 Qualifies for LDCT screening

## 2024-06-17 NOTE — Assessment & Plan Note (Signed)
 Well controlled blood pressure today. Current regimen is lisinopril  40 mg. No medication side effects noted.

## 2024-06-17 NOTE — Assessment & Plan Note (Signed)
 LDL is  Lab Results  Component Value Date   LDLCALC 114 (H) 05/23/2023    Current medication regimen is atorvastatin  10 mg. Goal LDL is < 100.

## 2024-06-18 LAB — MICROALBUMIN / CREATININE URINE RATIO
Creatinine, Urine: 62.3 mg/dL
Microalb/Creat Ratio: 37 mg/g{creat} — ABNORMAL HIGH (ref 0–29)
Microalbumin, Urine: 23.3 ug/mL

## 2024-07-10 ENCOUNTER — Other Ambulatory Visit: Payer: Self-pay

## 2024-07-10 DIAGNOSIS — Z87891 Personal history of nicotine dependence: Secondary | ICD-10-CM

## 2024-07-10 DIAGNOSIS — F17210 Nicotine dependence, cigarettes, uncomplicated: Secondary | ICD-10-CM

## 2024-07-10 DIAGNOSIS — Z122 Encounter for screening for malignant neoplasm of respiratory organs: Secondary | ICD-10-CM
